# Patient Record
Sex: Female | Born: 1980 | Race: White | Hispanic: No | Marital: Married
Health system: Southern US, Community
[De-identification: ages and names within clinical notes are randomized; demographics above are authoritative.]

## PROBLEM LIST (undated history)

## (undated) DIAGNOSIS — I10 Essential (primary) hypertension: Secondary | ICD-10-CM

## (undated) DIAGNOSIS — F419 Anxiety disorder, unspecified: Secondary | ICD-10-CM

---

## 2006-12-05 ENCOUNTER — Other Ambulatory Visit: Admission: RE | Admit: 2006-12-05 | Discharge: 2006-12-05 | Payer: Self-pay | Admitting: Obstetrics and Gynecology

## 2011-06-22 ENCOUNTER — Other Ambulatory Visit: Payer: Self-pay | Admitting: Obstetrics and Gynecology

## 2011-06-22 DIAGNOSIS — Z1231 Encounter for screening mammogram for malignant neoplasm of breast: Secondary | ICD-10-CM

## 2011-07-18 ENCOUNTER — Ambulatory Visit
Admission: RE | Admit: 2011-07-18 | Discharge: 2011-07-18 | Disposition: A | Discharge status: Home / Self Care | Payer: PRIVATE HEALTH INSURANCE | Source: Ambulatory Visit | Attending: Obstetrics and Gynecology | Admitting: Obstetrics and Gynecology

## 2011-07-18 DIAGNOSIS — Z1231 Encounter for screening mammogram for malignant neoplasm of breast: Secondary | ICD-10-CM

## 2013-07-03 ENCOUNTER — Other Ambulatory Visit: Payer: Self-pay

## 2013-07-03 DIAGNOSIS — Z1231 Encounter for screening mammogram for malignant neoplasm of breast: Secondary | ICD-10-CM

## 2013-07-03 DIAGNOSIS — Z803 Family history of malignant neoplasm of breast: Secondary | ICD-10-CM

## 2013-07-22 ENCOUNTER — Ambulatory Visit
Admission: RE | Admit: 2013-07-22 | Discharge: 2013-07-22 | Disposition: A | Discharge status: Home / Self Care | Payer: BC Managed Care – PPO | Source: Ambulatory Visit

## 2013-07-22 DIAGNOSIS — Z803 Family history of malignant neoplasm of breast: Secondary | ICD-10-CM

## 2013-07-22 DIAGNOSIS — Z1231 Encounter for screening mammogram for malignant neoplasm of breast: Secondary | ICD-10-CM

## 2014-06-23 ENCOUNTER — Other Ambulatory Visit: Payer: Self-pay

## 2014-06-23 DIAGNOSIS — Z1231 Encounter for screening mammogram for malignant neoplasm of breast: Secondary | ICD-10-CM

## 2014-07-28 ENCOUNTER — Ambulatory Visit: Payer: Self-pay

## 2014-08-01 ENCOUNTER — Ambulatory Visit: Payer: Self-pay

## 2014-08-04 ENCOUNTER — Ambulatory Visit: Payer: Self-pay

## 2014-08-05 ENCOUNTER — Ambulatory Visit
Admission: RE | Admit: 2014-08-05 | Discharge: 2014-08-05 | Disposition: A | Discharge status: Home / Self Care | Payer: BLUE CROSS/BLUE SHIELD | Source: Ambulatory Visit

## 2014-08-05 DIAGNOSIS — Z1231 Encounter for screening mammogram for malignant neoplasm of breast: Secondary | ICD-10-CM

## 2015-09-29 ENCOUNTER — Other Ambulatory Visit: Payer: Self-pay | Admitting: Obstetrics and Gynecology

## 2015-09-29 DIAGNOSIS — R5381 Other malaise: Secondary | ICD-10-CM

## 2015-09-30 ENCOUNTER — Other Ambulatory Visit: Payer: Self-pay | Admitting: Obstetrics and Gynecology

## 2015-09-30 DIAGNOSIS — Z803 Family history of malignant neoplasm of breast: Secondary | ICD-10-CM

## 2015-09-30 DIAGNOSIS — Z1231 Encounter for screening mammogram for malignant neoplasm of breast: Secondary | ICD-10-CM

## 2015-10-19 ENCOUNTER — Ambulatory Visit
Admission: RE | Admit: 2015-10-19 | Discharge: 2015-10-19 | Disposition: A | Discharge status: Home / Self Care | Payer: BLUE CROSS/BLUE SHIELD | Source: Ambulatory Visit | Attending: Obstetrics and Gynecology | Admitting: Obstetrics and Gynecology

## 2015-10-19 DIAGNOSIS — Z803 Family history of malignant neoplasm of breast: Secondary | ICD-10-CM

## 2015-10-19 DIAGNOSIS — Z1231 Encounter for screening mammogram for malignant neoplasm of breast: Secondary | ICD-10-CM | POA: Diagnosis not present

## 2016-02-29 DIAGNOSIS — R05 Cough: Secondary | ICD-10-CM | POA: Diagnosis not present

## 2016-02-29 DIAGNOSIS — J309 Allergic rhinitis, unspecified: Secondary | ICD-10-CM | POA: Diagnosis not present

## 2016-02-29 DIAGNOSIS — J0100 Acute maxillary sinusitis, unspecified: Secondary | ICD-10-CM | POA: Diagnosis not present

## 2016-09-06 DIAGNOSIS — Z01419 Encounter for gynecological examination (general) (routine) without abnormal findings: Secondary | ICD-10-CM | POA: Diagnosis not present

## 2016-09-06 DIAGNOSIS — Z6824 Body mass index (BMI) 24.0-24.9, adult: Secondary | ICD-10-CM | POA: Diagnosis not present

## 2017-09-08 DIAGNOSIS — Z6824 Body mass index (BMI) 24.0-24.9, adult: Secondary | ICD-10-CM | POA: Diagnosis not present

## 2017-09-08 DIAGNOSIS — Z1151 Encounter for screening for human papillomavirus (HPV): Secondary | ICD-10-CM | POA: Diagnosis not present

## 2017-09-08 DIAGNOSIS — Z01419 Encounter for gynecological examination (general) (routine) without abnormal findings: Secondary | ICD-10-CM | POA: Diagnosis not present

## 2017-09-12 ENCOUNTER — Other Ambulatory Visit: Payer: Self-pay | Admitting: Obstetrics and Gynecology

## 2017-09-12 DIAGNOSIS — Z139 Encounter for screening, unspecified: Secondary | ICD-10-CM

## 2017-10-11 HISTORY — PX: BREAST BIOPSY: SHX20

## 2017-10-23 ENCOUNTER — Ambulatory Visit
Admission: RE | Admit: 2017-10-23 | Discharge: 2017-10-23 | Disposition: A | Discharge status: Home / Self Care | Payer: BLUE CROSS/BLUE SHIELD | Source: Ambulatory Visit | Attending: Obstetrics and Gynecology | Admitting: Obstetrics and Gynecology

## 2017-10-23 DIAGNOSIS — Z1231 Encounter for screening mammogram for malignant neoplasm of breast: Secondary | ICD-10-CM | POA: Diagnosis not present

## 2017-10-23 DIAGNOSIS — Z139 Encounter for screening, unspecified: Secondary | ICD-10-CM

## 2017-10-25 ENCOUNTER — Other Ambulatory Visit: Payer: Self-pay | Admitting: Obstetrics and Gynecology

## 2017-10-25 DIAGNOSIS — R928 Other abnormal and inconclusive findings on diagnostic imaging of breast: Secondary | ICD-10-CM

## 2017-10-31 ENCOUNTER — Ambulatory Visit
Admission: RE | Admit: 2017-10-31 | Discharge: 2017-10-31 | Disposition: A | Discharge status: Home / Self Care | Payer: BLUE CROSS/BLUE SHIELD | Source: Ambulatory Visit | Attending: Obstetrics and Gynecology | Admitting: Obstetrics and Gynecology

## 2017-10-31 ENCOUNTER — Other Ambulatory Visit: Payer: Self-pay | Admitting: Obstetrics and Gynecology

## 2017-10-31 DIAGNOSIS — R928 Other abnormal and inconclusive findings on diagnostic imaging of breast: Secondary | ICD-10-CM

## 2017-10-31 DIAGNOSIS — R922 Inconclusive mammogram: Secondary | ICD-10-CM | POA: Diagnosis not present

## 2017-10-31 DIAGNOSIS — N6321 Unspecified lump in the left breast, upper outer quadrant: Secondary | ICD-10-CM | POA: Diagnosis not present

## 2017-10-31 DIAGNOSIS — N6320 Unspecified lump in the left breast, unspecified quadrant: Secondary | ICD-10-CM

## 2017-10-31 DIAGNOSIS — N6323 Unspecified lump in the left breast, lower outer quadrant: Secondary | ICD-10-CM | POA: Diagnosis not present

## 2017-11-01 ENCOUNTER — Ambulatory Visit
Admission: RE | Admit: 2017-11-01 | Discharge: 2017-11-01 | Disposition: A | Discharge status: Home / Self Care | Payer: BLUE CROSS/BLUE SHIELD | Source: Ambulatory Visit | Attending: Obstetrics and Gynecology | Admitting: Obstetrics and Gynecology

## 2017-11-01 ENCOUNTER — Other Ambulatory Visit: Payer: Self-pay | Admitting: Obstetrics and Gynecology

## 2017-11-01 DIAGNOSIS — N6323 Unspecified lump in the left breast, lower outer quadrant: Secondary | ICD-10-CM | POA: Diagnosis not present

## 2017-11-01 DIAGNOSIS — N6321 Unspecified lump in the left breast, upper outer quadrant: Secondary | ICD-10-CM | POA: Diagnosis not present

## 2017-11-01 DIAGNOSIS — N6320 Unspecified lump in the left breast, unspecified quadrant: Secondary | ICD-10-CM

## 2017-11-01 DIAGNOSIS — N6322 Unspecified lump in the left breast, upper inner quadrant: Secondary | ICD-10-CM | POA: Diagnosis not present

## 2017-11-01 DIAGNOSIS — N6012 Diffuse cystic mastopathy of left breast: Secondary | ICD-10-CM | POA: Diagnosis not present

## 2018-06-20 DIAGNOSIS — J329 Chronic sinusitis, unspecified: Secondary | ICD-10-CM | POA: Diagnosis not present

## 2018-06-20 DIAGNOSIS — B9689 Other specified bacterial agents as the cause of diseases classified elsewhere: Secondary | ICD-10-CM | POA: Diagnosis not present

## 2018-07-13 ENCOUNTER — Ambulatory Visit (INDEPENDENT_AMBULATORY_CARE_PROVIDER_SITE_OTHER): Payer: BLUE CROSS/BLUE SHIELD | Admitting: Family Medicine

## 2018-07-13 ENCOUNTER — Encounter: Payer: Self-pay | Admitting: Family Medicine

## 2018-07-13 VITALS — BP 136/98 | HR 125 | Temp 97.6°F | Ht 60.0 in | Wt 125.6 lb

## 2018-07-13 DIAGNOSIS — Z0000 Encounter for general adult medical examination without abnormal findings: Secondary | ICD-10-CM

## 2018-07-13 DIAGNOSIS — R030 Elevated blood-pressure reading, without diagnosis of hypertension: Secondary | ICD-10-CM

## 2018-07-13 NOTE — Progress Notes (Signed)
Patient: Meghan NorthMegan Rumbold MRN: [id number] DOB: [birth year] PCP: Orland MustardWolfe, Kristin Barcus, MD     Subjective:  Chief Complaint  Patient presents with  . Establish Care    HPI: The patient is a 38 y.o. female who presents today for annual exam. She denies any changes to past medical history. There have been no recent hospitalizations. They are following a well balanced diet and exercise plan. Weight has been stable. No complaints today.   She has a family history of breast cancer in her mother, great grandmother maternal side and paternal grandmother. Mom was diagnosed at age 37 years old. She had repeat when she was 43 years s/p double mastectomy. No family history of colon cancer.   There is no immunization history on file for this patient.  Mammogram: 10/2017. Normal. Yearly with mom history  Pap smear: 09/2017. Normal. -julie fisher-NP at [facility]  Hiv: unsure Tdap: 11/29/2016   Review of Systems  Constitutional: Negative for chills, fatigue and fever.  HENT: Negative for dental problem, ear pain, hearing loss and trouble swallowing.   Eyes: Negative for visual disturbance.  Respiratory: Negative for cough, chest tightness and shortness of breath.   Cardiovascular: Negative for chest pain, palpitations and leg swelling.  Gastrointestinal: Negative for abdominal pain, blood in stool, constipation, diarrhea and nausea.  Endocrine: Negative for cold intolerance, polydipsia, polyphagia and polyuria.  Genitourinary: Negative.  Negative for dysuria and hematuria.  Musculoskeletal: Negative for arthralgias, back pain and neck pain.  Skin: Negative.  Negative for rash.  Neurological: Negative for dizziness and headaches.  Psychiatric/Behavioral: Positive for sleep disturbance. Negative for dysphoric mood. The patient is nervous/anxious.     Allergies Patient has No Known Allergies.  Past Medical History Patient  has no past medical history on file.  Surgical History Patient  has no past  surgical history on file.  Family History Pateint's family history includes Breast cancer in her mother.  Social History Patient  reports that she has quit smoking. She has never used smokeless tobacco.    Objective: Vitals:   07/13/18 0800 07/13/18 0808  BP: (!) 138/92 (!) 136/98  Pulse: (!) 125   Temp: 97.6 F (36.4 C)   TempSrc: Oral   SpO2: 98%   Weight: 125 lb 9.6 oz (57 kg)   Height: 5' (1.524 m)     Body mass index is 24.53 kg/m.  Physical Exam Vitals signs reviewed.  Constitutional:      Appearance: She is well-developed.  HENT:     Right Ear: External ear normal.     Left Ear: External ear normal.  Eyes:     Conjunctiva/sclera: Conjunctivae normal.     Pupils: Pupils are equal, round, and reactive to light.  Neck:     Musculoskeletal: Normal range of motion and neck supple.     Thyroid: No thyromegaly.  Cardiovascular:     Rate and Rhythm: Regular rhythm. Tachycardia present.     Heart sounds: Normal heart sounds. No murmur.  Pulmonary:     Effort: Pulmonary effort is normal.     Breath sounds: Normal breath sounds.  Abdominal:     General: Bowel sounds are normal. There is no distension.     Palpations: Abdomen is soft.     Tenderness: There is no abdominal tenderness.  Lymphadenopathy:     Cervical: No cervical adenopathy.  Skin:    General: Skin is warm and dry.     Findings: No rash.  Neurological:     Mental Status: She is  alert and oriented to person, place, and time.     Cranial Nerves: No cranial nerve deficit.     Coordination: Coordination normal.     Deep Tendon Reflexes: Reflexes normal.  Psychiatric:        Behavior: Behavior normal.    Depression screen [facility] 2/9 07/13/2018  Decreased Interest 0  Down, Depressed, Hopeless 0  PHQ - 2 Score 0       Assessment/plan: 1. Annual physical exam Requesting records for pap/labs. She is unsure if she can get all of her labs, but will have them done again this summer through her husband's  work. If needs anything else, will come back in. Otherwise, keep up exercise/healthy diet. We are going to watch her blood pressure. Heart rate always elevated in doctor's office. F/u in one year. Patient counseling [x]    Nutrition: Stressed importance of moderation in sodium/caffeine intake, saturated fat and cholesterol, caloric balance, sufficient intake of fresh fruits, vegetables, fiber, calcium, iron, and 1 mg of folate supplement per day (for females capable of pregnancy).  [x]    Stressed the importance of regular exercise.   []    Substance Abuse: Discussed cessation/primary prevention of tobacco, alcohol, or other drug use; driving or other dangerous activities under the influence; availability of treatment for abuse.   [x]    Injury prevention: Discussed safety belts, safety helmets, smoke detector, smoking near bedding or upholstery.   [x]    Sexuality: Discussed sexually transmitted diseases, partner selection, use of condoms, avoidance of unintended pregnancy  and contraceptive alternatives.  [x]    Dental health: Discussed importance of regular tooth brushing, flossing, and dental visits.  [x]    Health maintenance and immunizations reviewed. Please refer to Health maintenance section.   2. Elevated blood pressure -diastolic a bit above goal. She is going to start a home log and monitor it over the next 3 months. Will let me know how she is doing. Goal is <130/90. She will come back in for appointment if elevated. DASH diet discussed, exercise already in place and weight is good. No family history.    Return in about 1 year (around 07/14/2019).   Orland Mustard, MD [facility]  07/13/2018

## 2018-09-18 ENCOUNTER — Other Ambulatory Visit: Payer: Self-pay | Admitting: Obstetrics and Gynecology

## 2018-09-18 DIAGNOSIS — Z1231 Encounter for screening mammogram for malignant neoplasm of breast: Secondary | ICD-10-CM

## 2018-11-07 DIAGNOSIS — Z6824 Body mass index (BMI) 24.0-24.9, adult: Secondary | ICD-10-CM | POA: Diagnosis not present

## 2018-11-07 DIAGNOSIS — Z01419 Encounter for gynecological examination (general) (routine) without abnormal findings: Secondary | ICD-10-CM | POA: Diagnosis not present

## 2018-11-22 ENCOUNTER — Ambulatory Visit
Admission: RE | Admit: 2018-11-22 | Discharge: 2018-11-22 | Disposition: A | Discharge status: Home / Self Care | Payer: BC Managed Care – PPO | Source: Ambulatory Visit | Attending: Obstetrics and Gynecology | Admitting: Obstetrics and Gynecology

## 2018-11-22 ENCOUNTER — Other Ambulatory Visit: Payer: Self-pay

## 2018-11-22 DIAGNOSIS — Z1231 Encounter for screening mammogram for malignant neoplasm of breast: Secondary | ICD-10-CM

## 2019-07-12 DIAGNOSIS — Z20828 Contact with and (suspected) exposure to other viral communicable diseases: Secondary | ICD-10-CM | POA: Diagnosis not present

## 2019-07-19 ENCOUNTER — Encounter: Payer: BLUE CROSS/BLUE SHIELD | Admitting: Family Medicine

## 2019-09-11 ENCOUNTER — Ambulatory Visit (INDEPENDENT_AMBULATORY_CARE_PROVIDER_SITE_OTHER): Payer: BC Managed Care – PPO | Admitting: Family Medicine

## 2019-09-11 ENCOUNTER — Other Ambulatory Visit: Payer: Self-pay

## 2019-09-11 ENCOUNTER — Encounter: Payer: Self-pay | Admitting: Family Medicine

## 2019-09-11 VITALS — BP 166/100 | HR 143 | Temp 97.8°F | Ht 60.0 in | Wt 129.8 lb

## 2019-09-11 DIAGNOSIS — Z0000 Encounter for general adult medical examination without abnormal findings: Secondary | ICD-10-CM | POA: Diagnosis not present

## 2019-09-11 DIAGNOSIS — E049 Nontoxic goiter, unspecified: Secondary | ICD-10-CM | POA: Diagnosis not present

## 2019-09-11 DIAGNOSIS — Z114 Encounter for screening for human immunodeficiency virus [HIV]: Secondary | ICD-10-CM

## 2019-09-11 DIAGNOSIS — F411 Generalized anxiety disorder: Secondary | ICD-10-CM | POA: Insufficient documentation

## 2019-09-11 DIAGNOSIS — R030 Elevated blood-pressure reading, without diagnosis of hypertension: Secondary | ICD-10-CM

## 2019-09-11 LAB — COMPREHENSIVE METABOLIC PANEL
ALT: 27 U/L (ref 0–35)
AST: 22 U/L (ref 0–37)
Albumin: 5.1 g/dL (ref 3.5–5.2)
Alkaline Phosphatase: 67 U/L (ref 39–117)
BUN: 11 mg/dL (ref 6–23)
CO2: 25 mEq/L (ref 19–32)
Calcium: 10 mg/dL (ref 8.4–10.5)
Chloride: 100 mEq/L (ref 96–112)
Creatinine, Ser: 0.66 mg/dL (ref 0.40–1.20)
GFR: 100.38 mL/min (ref >60.00)
Glucose, Bld: 105 mg/dL — ABNORMAL HIGH (ref 70–99)
Potassium: 3.9 mEq/L (ref 3.5–5.1)
Sodium: 137 mEq/L (ref 135–145)
Total Bilirubin: 1.3 mg/dL — ABNORMAL HIGH (ref 0.2–1.2)
Total Protein: 8.3 g/dL (ref 6.0–8.3)

## 2019-09-11 LAB — CBC WITH DIFFERENTIAL/PLATELET
Basophils Absolute: 0 10*3/uL (ref 0.0–0.1)
Basophils Relative: 0.6 % (ref 0.0–3.0)
Eosinophils Absolute: 0.1 10*3/uL (ref 0.0–0.7)
Eosinophils Relative: 1 % (ref 0.0–5.0)
HCT: 43.4 % (ref 36.0–46.0)
Hemoglobin: 14.8 g/dL (ref 12.0–15.0)
Lymphocytes Relative: 27.8 % (ref 12.0–46.0)
Lymphs Abs: 2.2 10*3/uL (ref 0.7–4.0)
MCHC: 34.1 g/dL (ref 30.0–36.0)
MCV: 89.3 fl (ref 78.0–100.0)
Monocytes Absolute: 0.5 10*3/uL (ref 0.1–1.0)
Monocytes Relative: 5.8 % (ref 3.0–12.0)
Neutro Abs: 5.2 10*3/uL (ref 1.4–7.7)
Neutrophils Relative %: 64.8 % (ref 43.0–77.0)
Platelets: 371 10*3/uL (ref 150.0–400.0)
RBC: 4.87 Mil/uL (ref 3.87–5.11)
RDW: 12.8 % (ref 11.5–15.5)
WBC: 8 10*3/uL (ref 4.0–10.5)

## 2019-09-11 LAB — LIPID PANEL
Cholesterol: 226 mg/dL — ABNORMAL HIGH (ref 0–200)
HDL: 62.2 mg/dL (ref >39.00)
LDL Cholesterol: 142 mg/dL — ABNORMAL HIGH (ref 0–99)
NonHDL: 164.19
Total CHOL/HDL Ratio: 4
Triglycerides: 112 mg/dL (ref 0.0–149.0)
VLDL: 22.4 mg/dL (ref 0.0–40.0)

## 2019-09-11 LAB — VITAMIN D 25 HYDROXY (VIT D DEFICIENCY, FRACTURES): VITD: 33.51 ng/mL (ref 30.00–100.00)

## 2019-09-11 LAB — TSH: TSH: 1.17 u[IU]/mL (ref 0.35–4.50)

## 2019-09-11 MED ORDER — FLUOXETINE HCL 20 MG PO TABS: 20.0000 mg | ORAL_TABLET | Freq: Every day | ORAL | 0 refills | Status: DC

## 2019-09-11 NOTE — Progress Notes (Signed)
Patient: Meghan Shaw MRN: [id number] DOB: Jan 20, 1981 PCP: Orma Flaming, MD     Subjective:  Chief Complaint  Patient presents with  . Annual Exam    HPI: The patient is a 39 y.o. female who presents today for annual exam. She denies any changes to past medical history. There have been no recent hospitalizations. They are following a well balanced diet and exercise plan. Patient exercises at least 2-3 times per week. She says it has helped a lot with anxiety.  Weight has been stable. Patient is concerned about Anxiety.   Anxiety: She states this started a few months, but states she has always had anxiety. Since covid she started to have panic attacks. She has anxiety attacks about twice a year. No precipitating factors. No family issues, work issues or relational issues. She exercises regularly. She has done counseling in the past, but not for anxiety. Family history of anxiety in her brother. She has never been on medication. She sleeps good.   Elevated blood pressure: family hx in her father.    Immunization History  Administered Date(s) Administered  . PFIZER SARS-COV-2 Vaccination 08/17/2019   Colonoscopy:  Not of age Mammogram: routine screening.  Pap smear: 09/2019.   Had her first covid shot. 2nd shot is Saturday.   Review of Systems  Constitutional: Negative for chills, fatigue and fever.  HENT: Negative for dental problem, ear pain, hearing loss, sinus pressure, sinus pain, sore throat and trouble swallowing.   Eyes: Negative for visual disturbance.  Respiratory: Negative for cough, chest tightness and shortness of breath.   Cardiovascular: Negative for chest pain, palpitations and leg swelling.  Gastrointestinal: Negative for abdominal pain, blood in stool, diarrhea, nausea and vomiting.  Endocrine: Negative for cold intolerance, polydipsia, polyphagia and polyuria.  Genitourinary: Negative for dysuria and hematuria.  Musculoskeletal: Negative for arthralgias.   Skin: Negative for rash.  Neurological: Negative for dizziness, light-headedness and headaches.  Psychiatric/Behavioral: Negative for confusion, dysphoric mood and sleep disturbance. The patient is nervous/anxious.     Allergies Patient has No Known Allergies.  Past Medical History Patient  has no past medical history on file.  Surgical History Patient  has a past surgical history that includes Breast biopsy (Left, 10/2017).  Family History Pateint's family history includes Breast cancer in her mother.  Social History Patient  reports that she has quit smoking. She has never used smokeless tobacco.    Objective: Vitals:   09/11/19 1327 09/11/19 1353  BP: (!) 162/120 (!) 166/100  Pulse: (!) 143   Temp: 97.8 F (36.6 C)   TempSrc: Temporal   SpO2: 98%   Weight: 129 lb 12.8 oz (58.9 kg)   Height: 5' (1.524 m)     Body mass index is 25.35 kg/m.  Physical Exam Vitals reviewed.  Constitutional:      Appearance: Normal appearance. She is well-developed and normal weight.  HENT:     Head: Normocephalic and atraumatic.     Right Ear: Tympanic membrane, ear canal and external ear normal.     Left Ear: Tympanic membrane, ear canal and external ear normal.  Eyes:     Conjunctiva/sclera: Conjunctivae normal.     Pupils: Pupils are equal, round, and reactive to light.  Neck:     Thyroid: No thyromegaly.     Comments: Enlarged thyroid, right lower lobe.  Cardiovascular:     Rate and Rhythm: Regular rhythm. Tachycardia present.     Pulses: Normal pulses.     Heart sounds: Normal  heart sounds. No murmur.  Pulmonary:     Effort: Pulmonary effort is normal.     Breath sounds: Normal breath sounds.  Abdominal:     General: Bowel sounds are normal. There is no distension.     Palpations: Abdomen is soft.     Tenderness: There is no abdominal tenderness.  Musculoskeletal:     Cervical back: Normal range of motion and neck supple.  Lymphadenopathy:     Cervical: No cervical  adenopathy.  Skin:    General: Skin is warm and dry.     Capillary Refill: Capillary refill takes less than 2 seconds.     Findings: No rash.  Neurological:     General: No focal deficit present.     Mental Status: She is alert and oriented to person, place, and time.     Cranial Nerves: No cranial nerve deficit.     Coordination: Coordination normal.     Deep Tendon Reflexes: Reflexes normal.  Psychiatric:        Mood and Affect: Mood normal.        Behavior: Behavior normal.     Comments: Tearful at times. Anxious       Office Visit from 09/11/2019 in [facility]  PHQ-2 Total Score  0          GAD 7 : Generalized Anxiety Score 09/11/2019  Nervous, Anxious, on Edge 1  Control/stop worrying 1  Worry too much - different things 3  Trouble relaxing 1  Restless 1  Easily annoyed or irritable 3  Afraid - awful might happen 3  Total GAD 7 Score 13  Anxiety Difficulty Not difficult at all     Assessment/plan: 1. Annual physical exam Routine fasting lab work today. HM discussed. UTD. Gets pap smears at [facility]. Healthy and exercising. F/u in one year or as needed.  Patient counseling [x]    Nutrition: Stressed importance of moderation in sodium/caffeine intake, saturated fat and cholesterol, caloric balance, sufficient intake of fresh fruits, vegetables, fiber, calcium, iron, and 1 mg of folate supplement per day (for females capable of pregnancy).  [x]    Stressed the importance of regular exercise.   []    Substance Abuse: Discussed cessation/primary prevention of tobacco, alcohol, or other drug use; driving or other dangerous activities under the influence; availability of treatment for abuse.   [x]    Injury prevention: Discussed safety belts, safety helmets, smoke detector, smoking near bedding or upholstery.   [x]    Sexuality: Discussed sexually transmitted diseases, partner selection, use of condoms, avoidance of unintended pregnancy  and  contraceptive alternatives.  [x]    Dental health: Discussed importance of regular tooth brushing, flossing, and dental visits.  [x]    Health maintenance and immunizations reviewed. Please refer to Health maintenance section.    - CBC with Differential/Platelet - Comprehensive metabolic panel - Lipid panel - TSH - VITAMIN D 25 Hydroxy (Vit-D Deficiency, Fractures)  2. Encounter for screening for HIV  - HIV Antibody (routine testing w rflx)  3. Enlarged thyroid gland  - THYROID; Future  4. GAD (generalized anxiety disorder) Checking labs to rule out pathological cause. ? If HTN contributing as well. GAD7 significantly elevated and she is noticeably nervous. Discussed since affecting her daily life, I def. Think we should start medication if labs normal. Sending in prozac to start low dose daily. I've explained to her that drugs of the SSRI class can have side effects such as weight gain, sexual dysfunction, insomnia, headache, nausea.  These medications are generally effective at alleviating symptoms of anxiety and/or depression. Let me know if significant side effects do occur. Any si/hi she is to call 911 or go to Er. Close f/u with me in 1 months.   5. Elevated blood pressure -never came back due to covid on last visit. Unsure if this is contributing to anxiety or anxiety is contributing to this. We are going to have her keep a log at home x 1 month. She is to call me if consistently elevated after 2 weeks. Close follow up in one month. Recommended she cut salt out of diet as well. Already exercising. If elevated at next visit with elevated readings we will initiate therapy, get ekg and urine baseline. precautions given.   This visit occurred during the SARS-CoV-2 public health emergency.  Safety protocols were in place, including screening questions prior to the visit, additional usage of staff PPE, and extensive cleaning of exam room while observing appropriate contact time as indicated  for disinfecting solutions.     Return in about 1 month (around 10/11/2019) for anxiety/blood pressure .     Orland Mustard, MD [facility]  09/11/2019

## 2019-09-11 NOTE — Patient Instructions (Signed)
1) got get blood pressure cuff and start taking blood pressure 4x/week or so. Cut out sodium. We will see you back in one month or email me sooner if running high >140/90.   2) getting ultrasound of your thyroid. They will call you with this.   3) starting prozac for anxiety. Will take one/day in the AM. Takes about 3-4 weeks to feel an affect. Will see you back in  One month.   You're doing great.  Dr. Artis Flock   Hypertension, Adult Hypertension is another name for high blood pressure. High blood pressure forces your heart to work harder to pump blood. This can cause problems over time. There are two numbers in a blood pressure reading. There is a top number (systolic) over a bottom number (diastolic). It is best to have a blood pressure that is below 120/80. Healthy choices can help lower your blood pressure, or you may need medicine to help lower it. What are the causes? The cause of this condition is not known. Some conditions may be related to high blood pressure. What increases the risk?  Smoking.  Having type 2 diabetes mellitus, high cholesterol, or both.  Not getting enough exercise or physical activity.  Being overweight.  Having too much fat, sugar, calories, or salt (sodium) in your diet.  Drinking too much alcohol.  Having long-term (chronic) kidney disease.  Having a family history of high blood pressure.  Age. Risk increases with age.  Race. You may be at higher risk if you are African American.  Gender. Men are at higher risk than women before age 15. After age 4, women are at higher risk than men.  Having obstructive sleep apnea.  Stress. What are the signs or symptoms?  High blood pressure may not cause symptoms. Very high blood pressure (hypertensive crisis) may cause: ? Headache. ? Feelings of worry or nervousness (anxiety). ? Shortness of breath. ? Nosebleed. ? A feeling of being sick to your stomach (nausea). ? Throwing up (vomiting). ? Changes in  how you see. ? Very bad chest pain. ? Seizures. How is this treated?  This condition is treated by making healthy lifestyle changes, such as: ? Eating healthy foods. ? Exercising more. ? Drinking less alcohol.  Your health care provider may prescribe medicine if lifestyle changes are not enough to get your blood pressure under control, and if: ? Your top number is above 130. ? Your bottom number is above 80.  Your personal target blood pressure may vary. Follow these instructions at home: Eating and drinking   If told, follow the DASH eating plan. To follow this plan: ? Fill one half of your plate at each meal with fruits and vegetables. ? Fill one fourth of your plate at each meal with whole grains. Whole grains include whole-wheat pasta, brown rice, and whole-grain bread. ? Eat or drink low-fat dairy products, such as skim milk or low-fat yogurt. ? Fill one fourth of your plate at each meal with low-fat (lean) proteins. Low-fat proteins include fish, chicken without skin, eggs, beans, and tofu. ? Avoid fatty meat, cured and processed meat, or chicken with skin. ? Avoid pre-made or processed food.  Eat less than 1,500 mg of salt each day.  Do not drink alcohol if: ? Your doctor tells you not to drink. ? You are pregnant, may be pregnant, or are planning to become pregnant.  If you drink alcohol: ? Limit how much you use to:  0-1 drink a day for women.  0-2 drinks a day for men. ? Be aware of how much alcohol is in your drink. In the [place] one drink equals one 12 oz bottle of beer (355 mL), one 5 oz glass of wine (148 mL), or one 1 oz glass of hard liquor (44 mL). Lifestyle   Work with your doctor to stay at a healthy weight or to lose weight. Ask your doctor what the best weight is for you.  Get at least 30 minutes of exercise most days of the week. This may include walking, swimming, or biking.  Get at least 30 minutes of exercise that strengthens your muscles  (resistance exercise) at least 3 days a week. This may include lifting weights or doing Pilates.  Do not use any products that contain nicotine or tobacco, such as cigarettes, e-cigarettes, and chewing tobacco. If you need help quitting, ask your doctor.  Check your blood pressure at home as told by your doctor.  Keep all follow-up visits as told by your doctor. This is important. Medicines  Take over-the-counter and prescription medicines only as told by your doctor. Follow directions carefully.  Do not skip doses of blood pressure medicine. The medicine does not work as well if you skip doses. Skipping doses also puts you at risk for problems.  Ask your doctor about side effects or reactions to medicines that you should watch for. Contact a doctor if you:  Think you are having a reaction to the medicine you are taking.  Have headaches that keep coming back (recurring).  Feel dizzy.  Have swelling in your ankles.  Have trouble with your vision. Get help right away if you:  Get a very bad headache.  Start to feel mixed up (confused).  Feel weak or numb.  Feel faint.  Have very bad pain in your: ? Chest. ? Belly (abdomen).  Throw up more than once.  Have trouble breathing. Summary  Hypertension is another name for high blood pressure.  High blood pressure forces your heart to work harder to pump blood.  For most people, a normal blood pressure is less than 120/80.  Making healthy choices can help lower blood pressure. If your blood pressure does not get lower with healthy choices, you may need to take medicine. This information is not intended to replace advice given to you by your health care provider. Make sure you discuss any questions you have with your health care provider. Document Revised: 02/07/2018 Document Reviewed: 02/07/2018 Elsevier Patient Education  2020 [employer].

## 2019-09-12 LAB — HIV ANTIBODY (ROUTINE TESTING W REFLEX): HIV 1&2 Ab, 4th Generation: NONREACTIVE

## 2019-09-16 ENCOUNTER — Ambulatory Visit
Admission: RE | Admit: 2019-09-16 | Discharge: 2019-09-16 | Disposition: A | Discharge status: Home / Self Care | Payer: BC Managed Care – PPO | Source: Ambulatory Visit | Attending: Family Medicine | Admitting: Family Medicine

## 2019-09-16 DIAGNOSIS — E041 Nontoxic single thyroid nodule: Secondary | ICD-10-CM | POA: Diagnosis not present

## 2019-09-16 DIAGNOSIS — E049 Nontoxic goiter, unspecified: Secondary | ICD-10-CM

## 2019-09-18 ENCOUNTER — Other Ambulatory Visit: Payer: Self-pay | Admitting: Family Medicine

## 2019-09-18 ENCOUNTER — Encounter: Payer: Self-pay | Admitting: Family Medicine

## 2019-09-18 DIAGNOSIS — E042 Nontoxic multinodular goiter: Secondary | ICD-10-CM

## 2019-09-23 DIAGNOSIS — Z20828 Contact with and (suspected) exposure to other viral communicable diseases: Secondary | ICD-10-CM | POA: Diagnosis not present

## 2019-10-09 ENCOUNTER — Other Ambulatory Visit: Payer: Self-pay

## 2019-10-11 ENCOUNTER — Encounter: Payer: Self-pay | Admitting: Endocrinology

## 2019-10-11 ENCOUNTER — Ambulatory Visit (INDEPENDENT_AMBULATORY_CARE_PROVIDER_SITE_OTHER): Payer: BC Managed Care – PPO | Admitting: Endocrinology

## 2019-10-11 ENCOUNTER — Other Ambulatory Visit: Payer: Self-pay

## 2019-10-11 ENCOUNTER — Other Ambulatory Visit (HOSPITAL_COMMUNITY)
Admission: RE | Admit: 2019-10-11 | Discharge: 2019-10-11 | Disposition: A | Discharge status: Home / Self Care | Payer: BC Managed Care – PPO | Source: Ambulatory Visit | Attending: Endocrinology | Admitting: Endocrinology

## 2019-10-11 VITALS — BP 160/98 | HR 136 | Ht 60.0 in | Wt 124.0 lb

## 2019-10-11 DIAGNOSIS — I10 Essential (primary) hypertension: Secondary | ICD-10-CM

## 2019-10-11 DIAGNOSIS — E042 Nontoxic multinodular goiter: Secondary | ICD-10-CM | POA: Insufficient documentation

## 2019-10-11 HISTORY — PX: BIOPSY THYROID: PRO38

## 2019-10-11 NOTE — Progress Notes (Signed)
Subjective:    Patient ID: Meghan Shaw, female    DOB: 07-Nov-1980, 39 y.o.   MRN: [id number]  HPI Pt is referred by Dr Artis Flock, for nodular thyroid.  Pt was noted to have a thyroid nodule, on a routine physical.  she is unaware of ever having had thyroid problems in the past.  He has no h/o XRT or surgery to the neck.   No past medical history on file.  Past Surgical History:  Procedure Laterality Date  . BREAST BIOPSY Left 10/2017   x2   benign    Social History   Socioeconomic History  . Marital status: Married    Spouse name: Not on file  . Number of children: Not on file  . Years of education: Not on file  . Highest education level: Not on file  Occupational History  . Not on file  Tobacco Use  . Smoking status: Former Games developer  . Smokeless tobacco: Never Used  Substance and Sexual Activity  . Alcohol use: Not on file  . Drug use: Not on file  . Sexual activity: Not on file  Other Topics Concern  . Not on file  Social History Narrative  . Not on file   Social Determinants of Health   Financial Resource Strain:   . Difficulty of Paying Living Expenses:   Food Insecurity:   . Worried About Programme researcher, broadcasting/film/video in the Last Year:   . Barista in the Last Year:   Transportation Needs:   . Freight forwarder (Medical):   Marland Kitchen Lack of Transportation (Non-Medical):   Physical Activity:   . Days of Exercise per Week:   . Minutes of Exercise per Session:   Stress:   . Feeling of Stress :   Social Connections:   . Frequency of Communication with Friends and Family:   . Frequency of Social Gatherings with Friends and Family:   . Attends Religious Services:   . Active Member of Clubs or Organizations:   . Attends Banker Meetings:   Marland Kitchen Marital Status:   Intimate Partner Violence:   . Fear of Current or Ex-Partner:   . Emotionally Abused:   Marland Kitchen Physically Abused:   . Sexually Abused:     Current Outpatient Medications on File Prior to Visit    Medication Sig Dispense Refill  . FLUoxetine (PROZAC) 20 MG tablet Take 1 tablet (20 mg total) by mouth daily. 90 tablet 0  . norgestimate-ethinyl estradiol (ORTHO-CYCLEN,SPRINTEC,PREVIFEM) 0.25-35 MG-MCG tablet Take by mouth.     No current facility-administered medications on file prior to visit.    No Known Allergies  Family History  Problem Relation Age of Onset  . Breast cancer Mother   . Thyroid disease Neg Hx     BP (!) 160/98   Pulse (!) 136   Ht 5' (1.524 m)   Wt 124 lb (56.2 kg)   SpO2 98%   BMI 24.22 kg/m    Review of Systems Denies weight change, hoarseness, neck pain, sob, cough, dysphagia, diarrhea, and flushing.       Objective:   Physical Exam VS: see vs page GEN: no distress HEAD: head: no deformity eyes: no periorbital swelling, no proptosis external nose and ears are normal NECK: 2 cm right thyroid nodule is palpable.   CHEST WALL: no deformity LUNGS: clear to auscultation CV: tachycardia rate, but reg rhythm, no murmur.  MUSCULOSKELETAL: muscle bulk and strength are grossly normal.  no obvious  joint swelling.  gait is normal and steady.   EXTEMITIES: no deformity.  no edema PULSES: DP are normal bilat NEURO:  cn 2-12 grossly intact.   readily moves all 4's.  sensation is intact to touch on all 4's.  No tremor.   SKIN:  Normal texture and temperature.  No rash or suspicious lesion is visible.  Not diaphoretic NODES:  None palpable at the neck PSYCH: alert, well-oriented.  Does not appear anxious nor depressed.      Lab Results  Component Value Date   TSH 1.17 09/11/2019   [place]: MNG is noted  I have reviewed outside records, and summarized: Pt was noted to have elevated MNG, and referred here.  HTN, wellness, and GAD were also addressed.     thyroid needle bx: Location: thyroid.   consent obtained, signed form on chart The area is first sprayed with cooling agent local: xylocaine 2%, with epinephrine prep: alcohol pad 4 bxs are done  with 25 and 37T needles no complications      Assessment & Plan:  HTN, new to me.  MNG, new, uncertain etiology.   Patient Instructions  Your blood pressure is high today, and your heart is racing.  Please see your primary care provider soon, to have it rechecked. Blood tests, and a 24-HR urine collection are requested for you today.  We'll let you know about these results, and the biopsy.  If as expected, no caner is found, Please come back for a follow-up appointment in 6 months.

## 2019-10-11 NOTE — Patient Instructions (Addendum)
Your blood pressure is high today, and your heart is racing.  Please see your primary care provider soon, to have it rechecked. Blood tests, and a 24-HR urine collection are requested for you today.  We'll let you know about these results, and the biopsy.  If as expected, no caner is found, Please come back for a follow-up appointment in 6 months.

## 2019-10-14 ENCOUNTER — Telehealth: Payer: Self-pay | Admitting: Endocrinology

## 2019-10-14 ENCOUNTER — Other Ambulatory Visit: Payer: Self-pay

## 2019-10-14 ENCOUNTER — Ambulatory Visit (INDEPENDENT_AMBULATORY_CARE_PROVIDER_SITE_OTHER): Payer: BC Managed Care – PPO | Admitting: Family Medicine

## 2019-10-14 ENCOUNTER — Encounter: Payer: Self-pay | Admitting: Family Medicine

## 2019-10-14 VITALS — BP 160/108 | HR 90 | Temp 97.5°F | Ht 60.0 in | Wt 125.8 lb

## 2019-10-14 DIAGNOSIS — I10 Essential (primary) hypertension: Secondary | ICD-10-CM

## 2019-10-14 DIAGNOSIS — F411 Generalized anxiety disorder: Secondary | ICD-10-CM

## 2019-10-14 LAB — MICROALBUMIN / CREATININE URINE RATIO
Creatinine,U: 236.2 mg/dL
Microalb Creat Ratio: 0.8 mg/g (ref 0.0–30.0)
Microalb, Ur: 1.8 mg/dL (ref 0.0–1.9)

## 2019-10-14 MED ORDER — HYDROCHLOROTHIAZIDE 25 MG PO TABS: 25.0000 mg | ORAL_TABLET | Freq: Every day | ORAL | 3 refills | Status: DC

## 2019-10-14 NOTE — Telephone Encounter (Signed)
Meghan Shaw from [facility] calling stating that the thyroid speciman we sent in to them was done incorrectly. She states it needs to be not written on the whole slide and not written in pencil. And it needs to be written on the rough side rather than the smooth side. [phone]

## 2019-10-14 NOTE — Patient Instructions (Signed)
1) for your blood pressure, starting hctz (diuretic) for blood pressure. I want you to take 1/2 tab daily x 1-2 weeks. If blood pressure is still 140/90 or greater bump up to full pill. Make sure and drink water on this. Will check potassium/pressure in 4-6 weeks.   2) anxiety. Continue 20mg /day. If still feeling anxious, we can bump up to 40mg /day.

## 2019-10-14 NOTE — Progress Notes (Signed)
Patient: Meghan Shaw MRN: [id number] DOB: [birth year] PCP: Orland Mustard, MD     Subjective:  Chief Complaint  Patient presents with  . Hypertension  . Anxiety    HPI: The patient is a 39 y.o. female who presents today for 1 month follow up for  Anxiety and Hypertension.   GAD -I started her on prozac at last visit. She is currently on 20mg /day. She states she feels better. She has had no side effects with this dosage. She still feels anxious everyday but has had a lot of medical stuff happen this month with thyroid biopsy, appointments, etc. She is exercising.    HTN -I had her keep a log over the last month. Her log is elevated consistently, especially the lower number. She feels better than when I saw her last time. She also saw the endocrinologist who is running labs for pheochromocytoma.    Has had her 2nd covid shot.   Review of Systems  Constitutional: Negative for chills, fatigue and fever.  Respiratory: Negative for cough, shortness of breath and wheezing.   Cardiovascular: Negative for chest pain and palpitations.  Neurological: Negative for dizziness, light-headedness and headaches.  Psychiatric/Behavioral: The patient is nervous/anxious.     Allergies Patient has No Known Allergies.  Past Medical History Patient  has no past medical history on file.  Surgical History Patient  has a past surgical history that includes Breast biopsy (Left, 10/2017) and Biopsy thyroid (10/11/2019).  Family History Pateint's family history includes Breast cancer in her mother.  Social History Patient  reports that she has quit smoking. She has never used smokeless tobacco.    Objective: Vitals:   10/14/19 0817 10/14/19 0833  BP: (!) 140/100 (!) 160/108  Pulse: (!) 113 90  Temp: (!) 97.5 F (36.4 C)   TempSrc: Temporal   SpO2: 99%   Weight: 125 lb 12.8 oz (57.1 kg)   Height: 5' (1.524 m)     Body mass index is 24.57 kg/m.  Physical Exam Vitals reviewed.   Constitutional:      Appearance: Normal appearance.  HENT:     Head: Normocephalic and atraumatic.  Eyes:     Extraocular Movements: Extraocular movements intact.     Pupils: Pupils are equal, round, and reactive to light.  Neck:     Comments: Thyromegaly on right lobe. Well healed incision from biopsy  Cardiovascular:     Rate and Rhythm: Normal rate and regular rhythm.     Heart sounds: Normal heart sounds.  Pulmonary:     Effort: Pulmonary effort is normal.     Breath sounds: Normal breath sounds.  Abdominal:     General: Abdomen is flat. Bowel sounds are normal.     Palpations: Abdomen is soft.  Musculoskeletal:     Cervical back: Normal range of motion and neck supple.  Skin:    Capillary Refill: Capillary refill takes less than 2 seconds.  Neurological:     General: No focal deficit present.     Mental Status: She is alert and oriented to person, place, and time.  Psychiatric:        Mood and Affect: Mood normal.        Behavior: Behavior normal.        GAD 7 : Generalized Anxiety Score 10/14/2019 09/11/2019  Nervous, Anxious, on Edge 3 1  Control/stop worrying 2 1  Worry too much - different things 3 3  Trouble relaxing 2 1  Restless 1 1  Easily annoyed  or irritable 2 3  Afraid - awful might happen 3 3  Total GAD 7 Score 16 13  Anxiety Difficulty Somewhat difficult Not difficult at all   Ekg: sinus tachy with rate of 101. Otherwise normal ekg.   Assessment/plan: 1. Hypertension, unspecified type New diagnosis of HTN. Strong family history. Baseline ekg today. Labs done on last visit. Endo ruling out pheochromocytoma. Continue with log. Starting hctz 12.5mg  and can bump up to full pill after 1-2 weeks if BP still >140/90. I suspect she will need full pill and possibly second agent. Already doing low to no salt. Started back exercising. F/u in 4-6 weeks or recheck, potassium check and up/uc today. Let me know if any issues with medication. Encouraged water intake.   - Catecholamines, fractionated, urine, 24 hour - Metanephrines, urine, 24 hour  2. GAD (generalized anxiety disorder) GAD7 is more elevated than on last check. A significant increase; however, she has had a lot of medical stuff this month which she thinks is driving this up. She feels better on the prozac. She wants to keep dosage the same since she is done with her thyroid stuff and treat BP and see how her anxiety pans out. Im on board with this. She will f/u in 6 weeks for blood pressure and we will see if we need to increase her prozac at that time. She can also email me sooner as well. Continue with exercise.     This visit occurred during the SARS-CoV-2 public health emergency.  Safety protocols were in place, including screening questions prior to the visit, additional usage of staff PPE, and extensive cleaning of exam room while observing appropriate contact time as indicated for disinfecting solutions.     Return in about 6 weeks (around 11/25/2019) for blood pressure/anxiety .   Orma Flaming, MD [facility]   10/14/2019

## 2019-10-15 LAB — TIQ-MISC

## 2019-10-16 LAB — CYTOLOGY - NON PAP

## 2019-10-17 ENCOUNTER — Telehealth: Payer: Self-pay | Admitting: Internal Medicine

## 2019-10-17 NOTE — Telephone Encounter (Signed)
When we call back give the session# [id number]  [facility] called stating they needed the "total volume" for this patients labs. [phone]

## 2019-10-17 NOTE — Telephone Encounter (Signed)
I have absolutely no idea what this means.  Patient seen by Dr. Everardo All, not Dr. Elvera Lennox.

## 2019-10-18 ENCOUNTER — Other Ambulatory Visit: Payer: Self-pay

## 2019-10-18 NOTE — Telephone Encounter (Signed)
This is about labs so it has been sent to [facility]

## 2019-10-22 LAB — CATECHOLAMINES, FRACTIONATED, URINE, 24 HOUR
Calc Total (E+NE): 58 mcg/24 h (ref 26–121)
Creatinine, Urine mg/day-CATEUR: 2.11 g/(24.h) (ref 0.50–2.15)
Dopamine 24 Hr Urine: 718 mcg/24 h — ABNORMAL HIGH (ref 52–480)
Norepinephrine, 24H, Ur: 58 mcg/24 h (ref 15–100)
Total Volume: 2500 mL

## 2019-10-22 LAB — METANEPHRINES, URINE, 24 HOUR
Metaneph Total, Ur: 625 mcg/24 h (ref 115–695)
Metanephrines, Ur: 170 mcg/24 h (ref 36–190)
Normetanephrine, 24H Ur: 455 mcg/24 h (ref 35–482)

## 2019-10-23 LAB — METANEPHRINES, PLASMA
Metanephrine, Free: <25 pg/mL (ref <=57)
Normetanephrine, Free: 33 pg/mL (ref <=148)
Total Metanephrines-Plasma: 33 pg/mL (ref <=205)

## 2019-10-23 LAB — CATECHOLAMINES, FRACTIONATED, PLASMA
Dopamine: <10 pg/mL
Epinephrine: 89 pg/mL
Norepinephrine: 549 pg/mL
Total Catecholamines: 638 pg/mL

## 2019-11-08 ENCOUNTER — Other Ambulatory Visit: Payer: Self-pay | Admitting: Family Medicine

## 2019-11-08 DIAGNOSIS — Z1231 Encounter for screening mammogram for malignant neoplasm of breast: Secondary | ICD-10-CM

## 2019-11-15 DIAGNOSIS — Z6823 Body mass index (BMI) 23.0-23.9, adult: Secondary | ICD-10-CM | POA: Diagnosis not present

## 2019-11-15 DIAGNOSIS — Z01419 Encounter for gynecological examination (general) (routine) without abnormal findings: Secondary | ICD-10-CM | POA: Diagnosis not present

## 2019-11-27 ENCOUNTER — Other Ambulatory Visit: Payer: Self-pay

## 2019-11-27 ENCOUNTER — Ambulatory Visit
Admission: RE | Admit: 2019-11-27 | Discharge: 2019-11-27 | Disposition: A | Discharge status: Home / Self Care | Payer: BC Managed Care – PPO | Source: Ambulatory Visit | Attending: Family Medicine | Admitting: Family Medicine

## 2019-11-27 DIAGNOSIS — Z1231 Encounter for screening mammogram for malignant neoplasm of breast: Secondary | ICD-10-CM

## 2019-11-28 ENCOUNTER — Ambulatory Visit (INDEPENDENT_AMBULATORY_CARE_PROVIDER_SITE_OTHER): Payer: BC Managed Care – PPO | Admitting: Family Medicine

## 2019-11-28 ENCOUNTER — Encounter: Payer: Self-pay | Admitting: Family Medicine

## 2019-11-28 VITALS — BP 120/80 | HR 109 | Temp 98.0°F | Ht 60.0 in | Wt 117.2 lb

## 2019-11-28 DIAGNOSIS — R739 Hyperglycemia, unspecified: Secondary | ICD-10-CM

## 2019-11-28 DIAGNOSIS — F411 Generalized anxiety disorder: Secondary | ICD-10-CM

## 2019-11-28 DIAGNOSIS — I10 Essential (primary) hypertension: Secondary | ICD-10-CM

## 2019-11-28 LAB — BASIC METABOLIC PANEL
BUN: 15 mg/dL (ref 6–23)
CO2: 28 mEq/L (ref 19–32)
Calcium: 9.9 mg/dL (ref 8.4–10.5)
Chloride: 98 mEq/L (ref 96–112)
Creatinine, Ser: 0.83 mg/dL (ref 0.40–1.20)
GFR: 76.97 mL/min (ref >60.00)
Glucose, Bld: 112 mg/dL — ABNORMAL HIGH (ref 70–99)
Potassium: 4.4 mEq/L (ref 3.5–5.1)
Sodium: 136 mEq/L (ref 135–145)

## 2019-11-28 LAB — HEMOGLOBIN A1C: Hgb A1c MFr Bld: 5.2 % (ref 4.6–6.5)

## 2019-11-28 NOTE — Patient Instructions (Addendum)
You're doing awesome! So glad your blood pressure is to goal and your anxiety looks great.  See you in 6 months!  Dr. Artis Flock

## 2019-11-28 NOTE — Progress Notes (Signed)
Patient: Meghan Shaw MRN: [id number] DOB: 02/13/1981 PCP: Orma Flaming, MD     Subjective:  Chief Complaint  Patient presents with  . Hypertension  . Anxiety    HPI: The patient is a 39 y.o. female who presents today for Hypertension and Anxiety. She complains of hot flashes with prozac, starting 1 month ago.  Hypertension: Here for follow up of hypertension.  Currently on hctz 25mg  . Home readings range from 614-431 VQMGQQPY/19-50 diastolic. Takes medication as prescribed and denies any side effects. . Weight has been stable. Denies any chest pain, headaches, shortness of breath, vision changes, swelling in lower extremities.   GAD  She is currently on 20mg  of prozac. She can tell a noticeable difference in her anxiety and is very happy with medication. She does have occasional hot flashes which is likely from the prozac, but she states they are tolerable and doesn't want to change medication.   Review of Systems  Respiratory: Negative for cough, shortness of breath and wheezing.   Cardiovascular: Negative for chest pain and palpitations.  Endocrine: Positive for heat intolerance.  Neurological: Negative for dizziness, light-headedness and headaches.    Allergies Patient has No Known Allergies.  Past Medical History Patient  has no past medical history on file.  Surgical History Patient  has a past surgical history that includes Breast biopsy (Left, 10/2017) and Biopsy thyroid (10/11/2019).  Family History Pateint's family history includes Breast cancer in her mother.  Social History Patient  reports that she has quit smoking. She has never used smokeless tobacco.    Objective: Vitals:   11/28/19 0756  BP: 120/80  Pulse: (!) 109  Temp: 98 F (36.7 C)  TempSrc: Temporal  SpO2: 98%  Weight: 117 lb 3.2 oz (53.2 kg)  Height: 5' (1.524 m)    Body mass index is 22.89 kg/m.  Physical Exam Vitals reviewed.  Constitutional:      Appearance: Normal  appearance.  HENT:     Head: Normocephalic and atraumatic.  Neck:     Comments: Thyromegaly  Cardiovascular:     Rate and Rhythm: Regular rhythm. Tachycardia present.     Heart sounds: Normal heart sounds.  Pulmonary:     Effort: Pulmonary effort is normal.     Breath sounds: Normal breath sounds.  Abdominal:     General: Abdomen is flat. Bowel sounds are normal.     Palpations: Abdomen is soft.  Musculoskeletal:     Cervical back: Normal range of motion and neck supple.  Skin:    Capillary Refill: Capillary refill takes less than 2 seconds.  Neurological:     General: No focal deficit present.     Mental Status: She is alert and oriented to person, place, and time.  Psychiatric:        Mood and Affect: Mood normal.        Behavior: Behavior normal.        GAD 7 : Generalized Anxiety Score 11/28/2019 10/14/2019 09/11/2019  Nervous, Anxious, on Edge 1 3 1   Control/stop worrying 1 2 1   Worry too much - different things 1 3 3   Trouble relaxing 0 2 1  Restless 0 1 1  Easily annoyed or irritable 1 2 3   Afraid - awful might happen 1 3 3   Total GAD 7 Score 5 16 13   Anxiety Difficulty Not difficult at all Somewhat difficult Not difficult at all     Assessment/plan: 1. GAD (generalized anxiety disorder) GAD7 significantly improved! She is doing  much, much better. Could be combination of anti anxiety medication and getting her blood pressure under control. Very happy this is working. Continue current dosage of 20mg . F/u in 6 months or sooner if needed.   2. Essential hypertension Blood pressure is to goal. Continue current anti-hypertensive medications per hpi. Refills not needed and potassium will be checked today.  Recommended routine exercise and healthy diet including DASH diet and mediterranean diet. Encouraged weight loss. F/u in 6 months.   - Basic metabolic panel  3. Elevated blood sugar  - Hemoglobin A1c     This visit occurred during the SARS-CoV-2 public health  emergency.  Safety protocols were in place, including screening questions prior to the visit, additional usage of staff PPE, and extensive cleaning of exam room while observing appropriate contact time as indicated for disinfecting solutions.     Return in about 6 months (around 05/29/2020) for anxiety/bp .   05/31/2020, MD [facility]   11/28/2019

## 2019-12-04 ENCOUNTER — Other Ambulatory Visit: Payer: Self-pay | Admitting: Family Medicine

## 2019-12-25 ENCOUNTER — Encounter: Payer: Self-pay | Admitting: Family Medicine

## 2019-12-26 NOTE — Telephone Encounter (Signed)
Please advise 

## 2020-02-03 DIAGNOSIS — Z20822 Contact with and (suspected) exposure to covid-19: Secondary | ICD-10-CM | POA: Diagnosis not present

## 2020-02-25 ENCOUNTER — Other Ambulatory Visit: Payer: Self-pay | Admitting: Family Medicine

## 2020-02-25 MED ORDER — FLUOXETINE HCL 20 MG PO TABS: 20.0000 mg | ORAL_TABLET | Freq: Every day | ORAL | 0 refills | Status: DC

## 2020-04-24 ENCOUNTER — Other Ambulatory Visit: Payer: Self-pay

## 2020-04-24 ENCOUNTER — Encounter: Payer: Self-pay | Admitting: Endocrinology

## 2020-04-24 ENCOUNTER — Ambulatory Visit (INDEPENDENT_AMBULATORY_CARE_PROVIDER_SITE_OTHER): Payer: BC Managed Care – PPO | Admitting: Endocrinology

## 2020-04-24 ENCOUNTER — Other Ambulatory Visit (HOSPITAL_COMMUNITY)
Admission: RE | Admit: 2020-04-24 | Discharge: 2020-04-24 | Disposition: A | Discharge status: Home / Self Care | Payer: BC Managed Care – PPO | Source: Ambulatory Visit | Attending: Endocrinology | Admitting: Endocrinology

## 2020-04-24 VITALS — BP 138/90 | HR 104 | Ht 60.0 in | Wt 124.2 lb

## 2020-04-24 DIAGNOSIS — E042 Nontoxic multinodular goiter: Secondary | ICD-10-CM | POA: Diagnosis not present

## 2020-04-24 DIAGNOSIS — E041 Nontoxic single thyroid nodule: Secondary | ICD-10-CM | POA: Diagnosis not present

## 2020-04-24 NOTE — Progress Notes (Signed)
Subjective:    Patient ID: Meghan Shaw, female    DOB: 07-Aug-1980, 39 y.o.   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  Pt says she can notice the nodule, but it is unchanged.   No past medical history on file.  Past Surgical History:  Procedure Laterality Date  . BIOPSY THYROID  10/11/2019  . BREAST BIOPSY Left 10/2017   x2   benign    Social History   Socioeconomic History  . Marital status: Married    Spouse name: Not on file  . Number of children: Not on file  . Years of education: Not on file  . Highest education level: Not on file  Occupational History  . Not on file  Tobacco Use  . Smoking status: Former Games developer  . Smokeless tobacco: Never Used  Vaping Use  . Vaping Use: Never used  Substance and Sexual Activity  . Alcohol use: Not on file  . Drug use: Not on file  . Sexual activity: Not on file  Other Topics Concern  . Not on file  Social History Narrative  . Not on file   Social Determinants of Health   Financial Resource Strain:   . Difficulty of Paying Living Expenses: Not on file  Food Insecurity:   . Worried About Programme researcher, broadcasting/film/video in the Last Year: Not on file  . Ran Out of Food in the Last Year: Not on file  Transportation Needs:   . Lack of Transportation (Medical): Not on file  . Lack of Transportation (Non-Medical): Not on file  Physical Activity:   . Days of Exercise per Week: Not on file  . Minutes of Exercise per Session: Not on file  Stress:   . Feeling of Stress : Not on file  Social Connections:   . Frequency of Communication with Friends and Family: Not on file  . Frequency of Social Gatherings with Friends and Family: Not on file  . Attends Religious Services: Not on file  . Active Member of Clubs or Organizations: Not on file  . Attends Banker Meetings: Not on file  . Marital Status: Not on file  Intimate Partner Violence:   . Fear of Current  or Ex-Partner: Not on file  . Emotionally Abused: Not on file  . Physically Abused: Not on file  . Sexually Abused: Not on file    Current Outpatient Medications on File Prior to Visit  Medication Sig Dispense Refill  . FLUoxetine (PROZAC) 20 MG tablet Take 1 tablet (20 mg total) by mouth daily. 90 tablet 0  . hydrochlorothiazide (HYDRODIURIL) 25 MG tablet Take 1 tablet (25 mg total) by mouth daily. 90 tablet 3  . norgestimate-ethinyl estradiol (ORTHO-CYCLEN,SPRINTEC,PREVIFEM) 0.25-35 MG-MCG tablet Take by mouth.     No current facility-administered medications on file prior to visit.    No Known Allergies  Family History  Problem Relation Age of Onset  . Breast cancer Mother   . Thyroid disease Neg Hx     BP 138/90   Pulse (!) 104   Ht 5' (1.524 m)   Wt 124 lb 3.2 oz (56.3 kg)   SpO2 98%   BMI 24.26 kg/m    Review of Systems     Objective:   Physical Exam VITAL SIGNS:  See vs page Repeat HR=104 GENERAL: no distress NECK: 2 cm right thyroid nodule is gain palpable.  Lab Results  Component Value Date   TSH 1.17 09/11/2019   thyroid needle bx: Location: thyroid consent obtained, signed form on chart The area is first sprayed with cooling agent local: xylocaine 2%, with epinephrine prep: alcohol pad 4 bxs are done with 25 and 27g needles no complications Sent for Afirma analysis    Assessment & Plan:  We discussed plan based on Afirma result. Patient Instructions  We'll let you know about the biopsy results.   If as expected, no cancer is found, please come back for a follow-up appointment in 6 months.

## 2020-04-24 NOTE — Patient Instructions (Addendum)
We'll let you know about the biopsy results.  If as expected, no cancer is found, please come back for a follow-up appointment in 6 months  

## 2020-04-27 ENCOUNTER — Other Ambulatory Visit: Payer: Self-pay | Admitting: *Deleted

## 2020-04-28 LAB — CYTOLOGY - NON PAP

## 2020-05-21 ENCOUNTER — Other Ambulatory Visit: Payer: Self-pay

## 2020-05-21 ENCOUNTER — Ambulatory Visit (INDEPENDENT_AMBULATORY_CARE_PROVIDER_SITE_OTHER): Payer: BC Managed Care – PPO | Admitting: Family Medicine

## 2020-05-21 ENCOUNTER — Encounter: Payer: Self-pay | Admitting: Family Medicine

## 2020-05-21 VITALS — BP 124/82 | HR 116 | Temp 98.4°F | Ht 60.0 in | Wt 124.6 lb

## 2020-05-21 DIAGNOSIS — I10 Essential (primary) hypertension: Secondary | ICD-10-CM | POA: Diagnosis not present

## 2020-05-21 DIAGNOSIS — F411 Generalized anxiety disorder: Secondary | ICD-10-CM

## 2020-05-21 MED ORDER — FLUOXETINE HCL 20 MG PO TABS: 20.0000 mg | ORAL_TABLET | Freq: Every day | ORAL | 1 refills | Status: DC

## 2020-05-21 NOTE — Patient Instructions (Signed)
-  if you feel like yoru anxiety is worse than you can bump your prozac to 40mg . If your good days are still more than the bad I would start exercising again and stay on the 20mg , but please feel free to increase ot 40mg  if you feel like you are not controlled on the 20mg /day.   Blood pressure is great!   Merry christmas and have so much fun in ! Dr. 

## 2020-05-21 NOTE — Progress Notes (Signed)
Patient: Meghan Shaw MRN: [id number] DOB: 1981/05/29 PCP: Orland Mustard, MD     Subjective:  Chief Complaint  Patient presents with  . Hypertension  . Anxiety    HPI: The patient is a 39 y.o. female who presents today for anxiety and BP.  Hypertension: Here for follow up of hypertension.  Currently on hctz 25mg /day. Home readings range from 128 systolic/80-90 diastolic. Takes medication as prescribed and denies any side effects. Exercise includes none right now, will be getting back into this. Weight has been stable. Denies any chest pain, headaches, shortness of breath, vision changes, swelling in lower extremities.   Tachycardia Typically only at doctors. Runs around 80 at home.    Anxiety She is currently on prozac 20mg /day. She is doing well, but will still have some bad days. She states good days outweigh bad. She feels like she is tolerating well and isn't sure about titrating up the dose. She has not worked out in while. No panic attacks. No side effects from medication.   Review of Systems  Constitutional: Negative for chills, fatigue and fever.  HENT: Negative for dental problem, ear pain, hearing loss and trouble swallowing.   Eyes: Negative for visual disturbance.  Respiratory: Negative for cough, chest tightness and shortness of breath.   Cardiovascular: Negative for chest pain, palpitations and leg swelling.  Gastrointestinal: Negative for abdominal pain, blood in stool, diarrhea and nausea.  Endocrine: Negative for cold intolerance, polydipsia, polyphagia and polyuria.  Genitourinary: Negative for dysuria, frequency, hematuria and urgency.  Musculoskeletal: Negative for arthralgias.  Skin: Negative for rash.  Neurological: Negative for dizziness and headaches.  Psychiatric/Behavioral: Negative for dysphoric mood and sleep disturbance. The patient is not nervous/anxious.     Allergies Patient has No Known Allergies.  Past Medical History Patient  has no  past medical history on file.  Surgical History Patient  has a past surgical history that includes Breast biopsy (Left, 10/2017) and Biopsy thyroid (10/11/2019).  Family History Pateint's family history includes Breast cancer in her mother.  Social History Patient  reports that she has quit smoking. She has never used smokeless tobacco.    Objective: Vitals:   05/21/20 1059 05/21/20 1147  BP: 132/84 124/82  Pulse: (!) 116   Temp: 98.4 F (36.9 C)   TempSrc: Temporal   SpO2: 98%   Weight: 124 lb 9.6 oz (56.5 kg)   Height: 5' (1.524 m)     Body mass index is 24.33 kg/m.  Physical Exam Vitals reviewed.  Constitutional:      Appearance: Normal appearance. She is normal weight.  HENT:     Head: Normocephalic and atraumatic.  Neck:     Comments: Right thyroid goiter  Cardiovascular:     Rate and Rhythm: Regular rhythm. Tachycardia present.     Heart sounds: Normal heart sounds.  Pulmonary:     Effort: Pulmonary effort is normal.     Breath sounds: Normal breath sounds.  Abdominal:     General: Abdomen is flat. Bowel sounds are normal.     Palpations: Abdomen is soft.  Musculoskeletal:     Cervical back: Normal range of motion and neck supple.  Neurological:     General: No focal deficit present.     Mental Status: She is alert and oriented to person, place, and time.  Psychiatric:        Mood and Affect: Mood normal.        Behavior: Behavior normal.      GAD 7 :  Generalized Anxiety Score 05/21/2020 11/28/2019 10/14/2019 09/11/2019  Nervous, Anxious, on Edge 1 1 3 1   Control/stop worrying 1 1 2 1   Worry too much - different things 2 1 3 3   Trouble relaxing 1 0 2 1  Restless 0 0 1 1  Easily annoyed or irritable 1 1 2 3   Afraid - awful might happen 1 1 3 3   Total GAD 7 Score 7 5 16 13   Anxiety Difficulty Not difficult at all Not difficult at all Somewhat difficult Not difficult at all        Assessment/plan: 1. Primary hypertension .Blood pressure is to  goal. Continue current anti-hypertensive medications. Refills not given and routine lab work will be done at next visit. Recommended routine exercise and healthy diet including DASH diet and mediterranean diet. Encouraged weight loss. F/u in 6 months.    2. GAD (generalized anxiety disorder) GAD7 score is mild. She is having more good days than bad days and is not ready to titrate up medication. Do want her to start exercising again and if she feels like her bad days are becoming more frequent she can titrate up to 40mg , just need her to let me know. F/u in 6 months. Refilled her 20mg  px.     This visit occurred during the SARS-CoV-2 public health emergency.  Safety protocols were in place, including screening questions prior to the visit, additional usage of staff PPE, and extensive cleaning of exam room while observing appropriate contact time as indicated for disinfecting solutions.     Return in about 6 months (around 11/19/2020), or if symptoms worsen or fail to improve.   , MD [facility]   05/21/2020

## 2020-05-22 ENCOUNTER — Encounter: Payer: Self-pay | Admitting: Family Medicine

## 2020-05-22 ENCOUNTER — Ambulatory Visit: Payer: BC Managed Care – PPO | Admitting: Family Medicine

## 2020-05-22 DIAGNOSIS — Z20822 Contact with and (suspected) exposure to covid-19: Secondary | ICD-10-CM | POA: Diagnosis not present

## 2020-05-22 MED ORDER — FLUOXETINE HCL 40 MG PO CAPS: 40.0000 mg | ORAL_CAPSULE | Freq: Every day | ORAL | 3 refills | Status: DC

## 2020-05-29 ENCOUNTER — Ambulatory Visit: Payer: BC Managed Care – PPO | Admitting: Family Medicine

## 2020-06-03 DIAGNOSIS — Z20822 Contact with and (suspected) exposure to covid-19: Secondary | ICD-10-CM | POA: Diagnosis not present

## 2020-07-10 ENCOUNTER — Encounter: Payer: Self-pay | Admitting: Endocrinology

## 2020-09-01 ENCOUNTER — Other Ambulatory Visit: Payer: Self-pay | Admitting: Family Medicine

## 2020-09-01 ENCOUNTER — Encounter: Payer: Self-pay | Admitting: Family Medicine

## 2020-09-01 MED ORDER — HYDROCHLOROTHIAZIDE 25 MG PO TABS: 25.0000 mg | ORAL_TABLET | Freq: Every day | ORAL | 0 refills | Status: DC

## 2020-10-23 ENCOUNTER — Ambulatory Visit: Payer: BC Managed Care – PPO | Admitting: Endocrinology

## 2020-11-23 DIAGNOSIS — Z113 Encounter for screening for infections with a predominantly sexual mode of transmission: Secondary | ICD-10-CM | POA: Diagnosis not present

## 2020-11-23 DIAGNOSIS — Z124 Encounter for screening for malignant neoplasm of cervix: Secondary | ICD-10-CM | POA: Diagnosis not present

## 2020-11-23 DIAGNOSIS — Z01419 Encounter for gynecological examination (general) (routine) without abnormal findings: Secondary | ICD-10-CM | POA: Diagnosis not present

## 2020-11-23 DIAGNOSIS — Z01411 Encounter for gynecological examination (general) (routine) with abnormal findings: Secondary | ICD-10-CM | POA: Diagnosis not present

## 2020-11-23 DIAGNOSIS — Z6825 Body mass index (BMI) 25.0-25.9, adult: Secondary | ICD-10-CM | POA: Diagnosis not present

## 2020-11-23 DIAGNOSIS — Z3041 Encounter for surveillance of contraceptive pills: Secondary | ICD-10-CM | POA: Diagnosis not present

## 2020-11-23 LAB — HM PAP SMEAR

## 2020-11-27 ENCOUNTER — Other Ambulatory Visit: Payer: Self-pay | Admitting: Obstetrics and Gynecology

## 2020-11-27 DIAGNOSIS — Z1231 Encounter for screening mammogram for malignant neoplasm of breast: Secondary | ICD-10-CM

## 2020-11-27 DIAGNOSIS — Z803 Family history of malignant neoplasm of breast: Secondary | ICD-10-CM

## 2020-11-28 ENCOUNTER — Other Ambulatory Visit: Payer: Self-pay | Admitting: Family Medicine

## 2020-11-30 ENCOUNTER — Other Ambulatory Visit: Payer: Self-pay

## 2020-12-25 ENCOUNTER — Other Ambulatory Visit: Payer: Self-pay | Admitting: Obstetrics and Gynecology

## 2020-12-29 ENCOUNTER — Telehealth: Payer: Self-pay

## 2020-12-29 NOTE — Telephone Encounter (Signed)
LVM that Dr Wolfe has left the office and to call back for a toc appt 

## 2021-01-21 ENCOUNTER — Other Ambulatory Visit: Payer: Self-pay

## 2021-01-21 ENCOUNTER — Ambulatory Visit
Admission: RE | Admit: 2021-01-21 | Discharge: 2021-01-21 | Disposition: A | Discharge status: Home / Self Care | Payer: BC Managed Care – PPO | Source: Ambulatory Visit | Attending: Obstetrics and Gynecology | Admitting: Obstetrics and Gynecology

## 2021-01-21 DIAGNOSIS — Z1231 Encounter for screening mammogram for malignant neoplasm of breast: Secondary | ICD-10-CM | POA: Diagnosis not present

## 2021-01-21 DIAGNOSIS — Z803 Family history of malignant neoplasm of breast: Secondary | ICD-10-CM

## 2021-01-25 ENCOUNTER — Encounter (HOSPITAL_COMMUNITY)
Admission: RE | Admit: 2021-01-25 | Discharge: 2021-01-25 | Disposition: A | Discharge status: Home / Self Care | Payer: BC Managed Care – PPO | Source: Ambulatory Visit | Attending: Obstetrics and Gynecology | Admitting: Obstetrics and Gynecology

## 2021-01-25 ENCOUNTER — Other Ambulatory Visit: Payer: Self-pay

## 2021-01-25 DIAGNOSIS — Z302 Encounter for sterilization: Secondary | ICD-10-CM | POA: Diagnosis not present

## 2021-01-25 DIAGNOSIS — R000 Tachycardia, unspecified: Secondary | ICD-10-CM | POA: Insufficient documentation

## 2021-01-25 DIAGNOSIS — Z01818 Encounter for other preprocedural examination: Secondary | ICD-10-CM | POA: Insufficient documentation

## 2021-01-25 DIAGNOSIS — Z87891 Personal history of nicotine dependence: Secondary | ICD-10-CM | POA: Diagnosis not present

## 2021-01-25 DIAGNOSIS — I10 Essential (primary) hypertension: Secondary | ICD-10-CM | POA: Diagnosis not present

## 2021-01-25 DIAGNOSIS — Z79899 Other long term (current) drug therapy: Secondary | ICD-10-CM | POA: Diagnosis not present

## 2021-01-25 LAB — BASIC METABOLIC PANEL
Anion gap: 11 (ref 5–15)
BUN: 16 mg/dL (ref 6–20)
CO2: 23 mmol/L (ref 22–32)
Calcium: 9.3 mg/dL (ref 8.9–10.3)
Chloride: 102 mmol/L (ref 98–111)
Creatinine, Ser: 0.61 mg/dL (ref 0.44–1.00)
GFR, Estimated: >60 mL/min (ref >60)
Glucose, Bld: 102 mg/dL — ABNORMAL HIGH (ref 70–99)
Potassium: 4 mmol/L (ref 3.5–5.1)
Sodium: 136 mmol/L (ref 135–145)

## 2021-01-25 LAB — CBC
HCT: 41.6 % (ref 36.0–46.0)
Hemoglobin: 13.9 g/dL (ref 12.0–15.0)
MCH: 30.7 pg (ref 26.0–34.0)
MCHC: 33.4 g/dL (ref 30.0–36.0)
MCV: 91.8 fL (ref 80.0–100.0)
Platelets: 378 10*3/uL (ref 150–400)
RBC: 4.53 MIL/uL (ref 3.87–5.11)
RDW: 12.7 % (ref 11.5–15.5)
WBC: 8.8 10*3/uL (ref 4.0–10.5)
nRBC: 0 % (ref 0.0–0.2)

## 2021-01-25 LAB — HCG, SERUM, QUALITATIVE: Preg, Serum: NEGATIVE

## 2021-01-26 ENCOUNTER — Encounter (HOSPITAL_BASED_OUTPATIENT_CLINIC_OR_DEPARTMENT_OTHER): Payer: Self-pay | Admitting: Obstetrics and Gynecology

## 2021-01-26 ENCOUNTER — Other Ambulatory Visit: Payer: Self-pay

## 2021-01-26 NOTE — Progress Notes (Signed)
Spoke w/ via phone for pre-op interview---pt Lab needs dos---- none              Lab results------cbc, bmp t & s, serum preg all done 01-25-2021 epuc, ekg 01-25-2021 epic COVID test -----patient states asymptomatic no test needed Arrive at -------1130 am 01-28-2021 NPO after MN NO Solid Food.  Clear liquids from MN until---1030 am then npo Med rec completed Medications to take morning of surgery -----fluoxetine Diabetic medication -----n/a Patient instructed no nail polish to be worn day of surgery Patient instructed to bring photo id and insurance card day of surgery Patient aware to have Driver (ride ) / caregiver   spouse Mellody Dance  for 24 hours after surgery  Patient Special Instructions -----none Pre-Op special Istructions -----none Patient verbalized understanding of instructions that were given at this phone interview. Patient denies shortness of breath, chest pain, fever, cough at this phone interview.

## 2021-01-28 ENCOUNTER — Encounter (HOSPITAL_BASED_OUTPATIENT_CLINIC_OR_DEPARTMENT_OTHER)
Admission: RE | Disposition: A | Discharge status: Home / Self Care | Payer: Self-pay | Source: Home / Self Care | Attending: Obstetrics and Gynecology

## 2021-01-28 ENCOUNTER — Other Ambulatory Visit: Payer: Self-pay

## 2021-01-28 ENCOUNTER — Ambulatory Visit (HOSPITAL_BASED_OUTPATIENT_CLINIC_OR_DEPARTMENT_OTHER): Payer: BC Managed Care – PPO | Admitting: Anesthesiology

## 2021-01-28 ENCOUNTER — Ambulatory Visit (HOSPITAL_BASED_OUTPATIENT_CLINIC_OR_DEPARTMENT_OTHER)
Admission: RE | Admit: 2021-01-28 | Discharge: 2021-01-28 | Disposition: A | Discharge status: Home / Self Care | Payer: BC Managed Care – PPO | Attending: Obstetrics and Gynecology | Admitting: Obstetrics and Gynecology

## 2021-01-28 ENCOUNTER — Encounter (HOSPITAL_BASED_OUTPATIENT_CLINIC_OR_DEPARTMENT_OTHER): Payer: Self-pay | Admitting: Obstetrics and Gynecology

## 2021-01-28 DIAGNOSIS — Z79899 Other long term (current) drug therapy: Secondary | ICD-10-CM | POA: Insufficient documentation

## 2021-01-28 DIAGNOSIS — F411 Generalized anxiety disorder: Secondary | ICD-10-CM | POA: Diagnosis not present

## 2021-01-28 DIAGNOSIS — Z302 Encounter for sterilization: Secondary | ICD-10-CM | POA: Diagnosis not present

## 2021-01-28 DIAGNOSIS — Z87891 Personal history of nicotine dependence: Secondary | ICD-10-CM | POA: Diagnosis not present

## 2021-01-28 DIAGNOSIS — I10 Essential (primary) hypertension: Secondary | ICD-10-CM | POA: Insufficient documentation

## 2021-01-28 HISTORY — DX: Essential (primary) hypertension: I10

## 2021-01-28 HISTORY — PX: LAPAROSCOPIC TUBAL LIGATION: SHX1937

## 2021-01-28 HISTORY — DX: Anxiety disorder, unspecified: F41.9

## 2021-01-28 LAB — ABO/RH: ABO/RH(D): O POS

## 2021-01-28 LAB — TYPE AND SCREEN
ABO/RH(D): O POS
Antibody Screen: NEGATIVE

## 2021-01-28 LAB — POCT PREGNANCY, URINE: Preg Test, Ur: NEGATIVE

## 2021-01-28 SURGERY — LIGATION, FALLOPIAN TUBE, LAPAROSCOPIC
Anesthesia: General | Site: Abdomen | Laterality: Bilateral

## 2021-01-28 MED ORDER — BUPIVACAINE HCL (PF) 0.25 % IJ SOLN
INTRAMUSCULAR | Status: DC | PRN
  Administered 2021-01-28: 10 mL

## 2021-01-28 MED ORDER — MIDAZOLAM HCL 2 MG/2ML IJ SOLN
INTRAMUSCULAR | Status: AC
  Filled 2021-01-28: qty 2

## 2021-01-28 MED ORDER — KETOROLAC TROMETHAMINE 30 MG/ML IJ SOLN
INTRAMUSCULAR | Status: AC
  Filled 2021-01-28: qty 1

## 2021-01-28 MED ORDER — ONDANSETRON HCL 4 MG/2ML IJ SOLN
INTRAMUSCULAR | Status: DC | PRN
  Administered 2021-01-28: 4 mg via INTRAVENOUS

## 2021-01-28 MED ORDER — FENTANYL CITRATE (PF) 100 MCG/2ML IJ SOLN
INTRAMUSCULAR | Status: AC
  Filled 2021-01-28: qty 2

## 2021-01-28 MED ORDER — OXYCODONE HCL 5 MG PO TABS
5.0000 mg | ORAL_TABLET | Freq: Once | ORAL | Status: AC | PRN
  Administered 2021-01-28: 5 mg via ORAL

## 2021-01-28 MED ORDER — PROPOFOL 10 MG/ML IV BOLUS
INTRAVENOUS | Status: DC | PRN
  Administered 2021-01-28: 40 mg via INTRAVENOUS
  Administered 2021-01-28: 120 mg via INTRAVENOUS

## 2021-01-28 MED ORDER — SCOPOLAMINE 1 MG/3DAYS TD PT72
MEDICATED_PATCH | TRANSDERMAL | Status: AC
  Filled 2021-01-28: qty 1

## 2021-01-28 MED ORDER — OXYCODONE HCL 5 MG/5ML PO SOLN: 5.0000 mg | Freq: Once | ORAL | Status: AC | PRN

## 2021-01-28 MED ORDER — CHLORHEXIDINE GLUCONATE 0.12 % MT SOLN: 15.0000 mL | Freq: Once | OROMUCOSAL | Status: DC

## 2021-01-28 MED ORDER — LACTATED RINGERS IV SOLN: INTRAVENOUS | Status: DC

## 2021-01-28 MED ORDER — ONDANSETRON HCL 4 MG/2ML IJ SOLN
INTRAMUSCULAR | Status: AC
  Filled 2021-01-28: qty 2

## 2021-01-28 MED ORDER — 0.9 % SODIUM CHLORIDE (POUR BTL) OPTIME
TOPICAL | Status: DC | PRN
  Administered 2021-01-28: 500 mL

## 2021-01-28 MED ORDER — POVIDONE-IODINE 10 % EX SWAB: 2.0000 "application " | Freq: Once | CUTANEOUS | Status: DC

## 2021-01-28 MED ORDER — ROCURONIUM BROMIDE 100 MG/10ML IV SOLN
INTRAVENOUS | Status: DC | PRN
  Administered 2021-01-28: 40 mg via INTRAVENOUS

## 2021-01-28 MED ORDER — ACETAMINOPHEN 500 MG PO TABS
ORAL_TABLET | ORAL | Status: AC
  Filled 2021-01-28: qty 2

## 2021-01-28 MED ORDER — OXYCODONE HCL 5 MG PO TABS
ORAL_TABLET | ORAL | Status: AC
  Filled 2021-01-28: qty 1

## 2021-01-28 MED ORDER — FENTANYL CITRATE (PF) 100 MCG/2ML IJ SOLN
INTRAMUSCULAR | Status: DC | PRN
  Administered 2021-01-28: 50 ug via INTRAVENOUS
  Administered 2021-01-28: 100 ug via INTRAVENOUS
  Administered 2021-01-28: 50 ug via INTRAVENOUS

## 2021-01-28 MED ORDER — ACETAMINOPHEN 500 MG PO TABS
1000.0000 mg | ORAL_TABLET | Freq: Once | ORAL | Status: AC
  Administered 2021-01-28: 1000 mg via ORAL

## 2021-01-28 MED ORDER — OXYCODONE HCL 5 MG PO TABS: 5.0000 mg | ORAL_TABLET | Freq: Four times a day (QID) | ORAL | 0 refills | Status: DC | PRN

## 2021-01-28 MED ORDER — ORAL CARE MOUTH RINSE: 15.0000 mL | Freq: Once | OROMUCOSAL | Status: DC

## 2021-01-28 MED ORDER — DEXAMETHASONE SODIUM PHOSPHATE 4 MG/ML IJ SOLN
INTRAMUSCULAR | Status: DC | PRN
  Administered 2021-01-28: 5 mg via INTRAVENOUS

## 2021-01-28 MED ORDER — LIDOCAINE HCL (PF) 2 % IJ SOLN
INTRAMUSCULAR | Status: AC
  Filled 2021-01-28: qty 5

## 2021-01-28 MED ORDER — MIDAZOLAM HCL 5 MG/5ML IJ SOLN
INTRAMUSCULAR | Status: DC | PRN
  Administered 2021-01-28: 2 mg via INTRAVENOUS

## 2021-01-28 MED ORDER — IBUPROFEN 800 MG PO TABS: 800.0000 mg | ORAL_TABLET | Freq: Three times a day (TID) | ORAL | 0 refills | Status: DC | PRN

## 2021-01-28 MED ORDER — LIDOCAINE HCL (CARDIAC) PF 100 MG/5ML IV SOSY
PREFILLED_SYRINGE | INTRAVENOUS | Status: DC | PRN
  Administered 2021-01-28: 60 mg via INTRAVENOUS

## 2021-01-28 MED ORDER — DEXAMETHASONE SODIUM PHOSPHATE 10 MG/ML IJ SOLN
INTRAMUSCULAR | Status: AC
  Filled 2021-01-28: qty 1

## 2021-01-28 MED ORDER — KETOROLAC TROMETHAMINE 30 MG/ML IJ SOLN
INTRAMUSCULAR | Status: DC | PRN
  Administered 2021-01-28: 30 mg via INTRAVENOUS

## 2021-01-28 MED ORDER — FENTANYL CITRATE (PF) 100 MCG/2ML IJ SOLN
25.0000 ug | INTRAMUSCULAR | Status: DC | PRN
  Administered 2021-01-28 (×2): 25 ug via INTRAVENOUS

## 2021-01-28 MED ORDER — SUGAMMADEX SODIUM 200 MG/2ML IV SOLN
INTRAVENOUS | Status: DC | PRN
  Administered 2021-01-28: 200 mg via INTRAVENOUS

## 2021-01-28 MED ORDER — PROMETHAZINE HCL 25 MG/ML IJ SOLN: 6.2500 mg | INTRAMUSCULAR | Status: DC | PRN

## 2021-01-28 MED ORDER — PROPOFOL 10 MG/ML IV BOLUS
INTRAVENOUS | Status: AC
  Filled 2021-01-28: qty 20

## 2021-01-28 MED ORDER — SCOPOLAMINE 1 MG/3DAYS TD PT72
1.0000 | MEDICATED_PATCH | Freq: Once | TRANSDERMAL | Status: DC
  Administered 2021-01-28: 1.5 mg via TRANSDERMAL

## 2021-01-28 SURGICAL SUPPLY — 34 items
ADH SKN CLS APL DERMABOND .7 (GAUZE/BANDAGES/DRESSINGS) ×1
BAG SPEC RTRVL LRG 6X4 10 (ENDOMECHANICALS)
CABLE HIGH FREQUENCY MONO STRZ (ELECTRODE) IMPLANT
DERMABOND ADVANCED (GAUZE/BANDAGES/DRESSINGS) ×1
DERMABOND ADVANCED .7 DNX12 (GAUZE/BANDAGES/DRESSINGS) ×1 IMPLANT
DRSG OPSITE POSTOP 3X4 (GAUZE/BANDAGES/DRESSINGS) ×2 IMPLANT
DURAPREP 26ML APPLICATOR (WOUND CARE) ×2 IMPLANT
GAUZE 4X4 16PLY ~~LOC~~+RFID DBL (SPONGE) ×2 IMPLANT
GLOVE SURG LTX SZ6.5 (GLOVE) ×2 IMPLANT
GLOVE SURG UNDER POLY LF SZ7 (GLOVE) ×8 IMPLANT
GOWN STRL REUS W/TWL LRG LVL3 (GOWN DISPOSABLE) ×8 IMPLANT
KIT TURNOVER CYSTO (KITS) ×2 IMPLANT
LIGASURE VESSEL 5MM BLUNT TIP (ELECTROSURGICAL) ×2 IMPLANT
MANIPULATOR UTERINE 4.5 ZUMI (MISCELLANEOUS) IMPLANT
NEEDLE INSUFFLATION 120MM (ENDOMECHANICALS) ×2 IMPLANT
NS IRRIG 500ML POUR BTL (IV SOLUTION) ×2 IMPLANT
PACK LAPAROSCOPY BASIN (CUSTOM PROCEDURE TRAY) ×2 IMPLANT
PACK TRENDGUARD 450 HYBRID PRO (MISCELLANEOUS) IMPLANT
PENCIL BUTTON HOLSTER BLD 10FT (ELECTRODE) ×2 IMPLANT
POUCH SPECIMEN RETRIEVAL 10MM (ENDOMECHANICALS) IMPLANT
PROTECTOR NERVE ULNAR (MISCELLANEOUS) ×2 IMPLANT
SCISSORS LAP 5X35 DISP (ENDOMECHANICALS) IMPLANT
SET IRRIG TUBING LAPAROSCOPIC (IRRIGATION / IRRIGATOR) IMPLANT
SET TUBE SMOKE EVAC HIGH FLOW (TUBING) ×2 IMPLANT
SUT VICRYL 0 UR6 27IN ABS (SUTURE) ×2 IMPLANT
SUT VICRYL 4-0 PS2 18IN ABS (SUTURE) ×2 IMPLANT
SYR 5ML LL (SYRINGE) ×2 IMPLANT
TOWEL OR 17X26 10 PK STRL BLUE (TOWEL DISPOSABLE) ×4 IMPLANT
TRAY FOLEY W/BAG SLVR 14FR LF (SET/KITS/TRAYS/PACK) ×2 IMPLANT
TRENDGUARD 450 HYBRID PRO PACK (MISCELLANEOUS)
TROCAR BLADELESS OPT 5 100 (ENDOMECHANICALS) ×2 IMPLANT
TROCAR XCEL NON-BLD 11X100MML (ENDOMECHANICALS) IMPLANT
TROCAR XCEL NON-BLD 5MMX100MML (ENDOMECHANICALS) ×4 IMPLANT
WARMER LAPAROSCOPE (MISCELLANEOUS) ×2 IMPLANT

## 2021-01-28 NOTE — Anesthesia Preprocedure Evaluation (Addendum)
Anesthesia Evaluation  Patient identified by MRN, date of birth, ID band Patient awake    Reviewed: Allergy & Precautions, NPO status , Patient's Chart, lab work & pertinent test results  History of Anesthesia Complications Negative for: history of anesthetic complications  Airway Mallampati: II  TM Distance: >3 FB Neck ROM: Full    Dental  (+) Missing,    Pulmonary former smoker,    Pulmonary exam normal        Cardiovascular hypertension, Pt. on medications Normal cardiovascular exam     Neuro/Psych Anxiety negative neurological ROS     GI/Hepatic negative GI ROS, Neg liver ROS,   Endo/Other  negative endocrine ROS  Renal/GU negative Renal ROS  negative genitourinary   Musculoskeletal negative musculoskeletal ROS (+)   Abdominal   Peds  Hematology negative hematology ROS (+)   Anesthesia Other Findings Day of surgery medications reviewed with patient.  Reproductive/Obstetrics negative OB ROS                            Anesthesia Physical Anesthesia Plan  ASA: 2  Anesthesia Plan: General   Post-op Pain Management:    Induction: Intravenous  PONV Risk Score and Plan: 4 or greater and Scopolamine patch - Pre-op, Midazolam, Dexamethasone, Ondansetron and Treatment may vary due to age or medical condition  Airway Management Planned: Oral ETT  Additional Equipment: None  Intra-op Plan:   Post-operative Plan: Extubation in OR  Informed Consent: I have reviewed the patients History and Physical, chart, labs and discussed the procedure including the risks, benefits and alternatives for the proposed anesthesia with the patient or authorized representative who has indicated his/her understanding and acceptance.     Dental advisory given  Plan Discussed with: CRNA  Anesthesia Plan Comments:        Anesthesia Quick Evaluation

## 2021-01-28 NOTE — H&P (Signed)
Meghan Shaw is an 40 y.o. female presenting for scheduled surgery. Patient is G0 LMP 01/20/21 female who desires permanent tubal sterilization. Married for 10 years, No plans for future fertility. Her and husband have always felt that way, has thought about this a long time and is 100% certain she does not ever want a pregnancy Periods before OCP were regular and would like to be able to stop OCP once tubal done. Previously discussed risks/benefits of performing salpingectomy versus ligation and has opted for bilateral salpingectomy. No previous surgeries  Mild HTN well managed on HCTZ and Anxiety well managed on Fluoxetine by PCP- seen this year including normal labs  Up to date on AEX 11/23/20 including normal Pap   Menstrual History: Patient's last menstrual period was 01/20/2021 (approximate).    Past Medical History:  Diagnosis Date   Anxiety    Hypertension     Past Surgical History:  Procedure Laterality Date   BIOPSY THYROID  10/11/2019   benign   BREAST BIOPSY Left 10/2017   x2   benign    Family History  Problem Relation Age of Onset   Breast cancer Mother    Thyroid disease Neg Hx     Social History:  reports that she quit smoking about 10 years ago. Her smoking use included cigarettes. She has never used smokeless tobacco. She reports current alcohol use of about 2.0 standard drinks per week. She reports that she does not use drugs.  Allergies: No Known Allergies  Medications Prior to Admission  Medication Sig Dispense Refill Last Dose   acetaminophen (TYLENOL) 500 MG tablet Take 1,000 mg by mouth every 6 (six) hours as needed.   01/26/2021   FLUoxetine (PROZAC) 40 MG capsule Take 1 capsule (40 mg total) by mouth daily. 90 capsule 3 01/28/2021 at 0630   hydrochlorothiazide (HYDRODIURIL) 25 MG tablet TAKE 1 TABLET (25 MG TOTAL) BY MOUTH DAILY. 90 tablet 0 01/28/2021 at 0630   Multiple Vitamins-Minerals (MULTIVITAMIN WITH MINERALS) tablet Take 1 tablet by mouth daily.    01/28/2021 at 0630    Review of Systems  All other systems reviewed and are negative.  Blood pressure (!) 138/104, pulse (!) 117, temperature 98.6 F (37 C), temperature source Oral, resp. rate 14, height 5' (1.524 m), weight 60.6 kg, last menstrual period 01/20/2021, SpO2 100 %. Physical Exam Vitals reviewed.  Constitutional:      Appearance: Normal appearance.  HENT:     Head: Normocephalic.  Cardiovascular:     Rate and Rhythm: Normal rate.  Pulmonary:     Effort: Pulmonary effort is normal.  Abdominal:     General: Abdomen is flat.     Palpations: Abdomen is soft.  Genitourinary:    General: Normal vulva.  Musculoskeletal:        General: Normal range of motion.  Skin:    General: Skin is warm and dry.  Neurological:     General: No focal deficit present.     Mental Status: She is alert.  Psychiatric:        Mood and Affect: Mood normal.        Behavior: Behavior normal.    Results for orders placed or performed during the hospital encounter of 01/28/21 (from the past 24 hour(s))  Pregnancy, urine POC     Status: None   Collection Time: 01/28/21 11:35 AM  Result Value Ref Range   Preg Test, Ur NEGATIVE NEGATIVE    Assessment/Plan:  40Y G0 LMP 01/20/21 desires permanent  tubal sterilization consented for laparoscopic bilateral salpingectomy  Patient has been appropriately counseled on multiple visits regarding risks/benefits/alternatives to salpingectomy. She is 100% certain she does not desire future fertility and is aware of risk of regret. She is aware of risks for bleeding, infection, and damage to surrounding organs. She is aware of alternative contraceptive options including LARC's and declines these options. Patient consents signed for laparoscopic bilateral salpingectomy.  Routine preop care UPT NEG No antibiotics indicated SCD VTE ppx Routine postop care, anticipate DC home today DC instructions reviewed with the patient and will follow up in office for  routine postop visit in 2 weeks  Brittlyn Cloe A Leighton Luster 01/28/2021, 12:35 PM

## 2021-01-28 NOTE — Discharge Instructions (Signed)

## 2021-01-28 NOTE — Transfer of Care (Signed)
Immediate Anesthesia Transfer of Care Note  Patient: Meghan Shaw  Procedure(s) Performed: Procedure(s) (LRB): LAPAROSCOPIC TUBAL LIGATION By Salpingectomy (Bilateral)  Patient Location: PACU  Anesthesia Type: General  Level of Consciousness: awake, sedated, patient cooperative and responds to stimulation  Airway & Oxygen Therapy: Patient Spontanous Breathing and Patient connected to [place] 02 and soft FM   Post-op Assessment: Report given to PACU RN, Post -op Vital signs reviewed and stable and Patient moving all extremities  Post vital signs: Reviewed and stable  Complications: No apparent anesthesia complications

## 2021-01-28 NOTE — Anesthesia Postprocedure Evaluation (Signed)
Anesthesia Post Note  Patient: Kandis Cocking  Procedure(s) Performed: LAPAROSCOPIC TUBAL LIGATION By Salpingectomy (Bilateral: Abdomen)     Patient location during evaluation: PACU Anesthesia Type: General Level of consciousness: awake and alert and oriented Pain management: pain level controlled Vital Signs Assessment: post-procedure vital signs reviewed and stable Respiratory status: spontaneous breathing, nonlabored ventilation and respiratory function stable Cardiovascular status: blood pressure returned to baseline Postop Assessment: no apparent nausea or vomiting Anesthetic complications: no   No notable events documented.  Last Vitals:  Vitals:   01/28/21 1530 01/28/21 1531  BP:  (!) 111/91  Pulse: (!) 101 84  Resp: 16 10  Temp: 36.9 C 36.9 C  SpO2: 91% 96%    Last Pain:  Vitals:   01/28/21 1531  TempSrc:   PainSc: 1                  Shanda Howells

## 2021-01-28 NOTE — Anesthesia Procedure Notes (Signed)
Procedure Name: Intubation Date/Time: 01/28/2021 1:51 PM Performed by: Justice Rocher, CRNA Pre-anesthesia Checklist: Patient identified, Emergency Drugs available, Suction available, Patient being monitored and Timeout performed Patient Re-evaluated:Patient Re-evaluated prior to induction Oxygen Delivery Method: Circle system utilized Preoxygenation: Pre-oxygenation with 100% oxygen Induction Type: IV induction Ventilation: Mask ventilation without difficulty Laryngoscope Size: Mac and 3 Grade View: Grade I Tube type: Oral Tube size: 7.0 mm Number of attempts: 1 Airway Equipment and Method: Stylet and Oral airway Placement Confirmation: ETT inserted through vocal cords under direct vision, positive ETCO2, breath sounds checked- equal and bilateral and CO2 detector Secured at: 22 cm Tube secured with: Tape Dental Injury: Teeth and Oropharynx as per pre-operative assessment

## 2021-01-28 NOTE — Op Note (Signed)
Meghan Shaw 1981-02-13 [id number]  Operative Note  PROCEDURE: laparoscopic bilateral salpingectomy  PRE-OPERATIVE DIAGNOSIS: desires permanent female sterilization  POST-OPERATIVE DIAGNOSIS: desires permanent female sterilization  SURGEON: Dr. Clance Boll, DO  ASSISTANT: Arlan Organ, CNM  FINDINGS: pelvic exam reveals normal external female genitalia, normal appearing ectocervix without lesions, laparoscopy reveals normal female pelvic anatomy including small mobile uterus with smooth contours, normal appearing bilateral fallopian tubes and ovaries, general survey of the abdomen including liver edge grossly normal appearing  SPECIMENS: bilateral fallopian tubes  EBL: minimal  FLUIDS: 1,000 cc crystalloids  UOP: 100 cc clear  COMPLICATIONS: None  PROCEDURE IN DETAIL:   After the patient was appropriately consented in the holding area, she was taken to the operating room where general anesthesia was administered without complications. The patient was placed in the dorsal lithotomy position. Bilateral arms were tucked with the appropriate barrier padding. The patient was prepped and draped in the usual sterile fashion. The bladder was trained with a catheter. An appropriate time out was performed that verified the correct patient, procedure, and surgical team.   Pelvic exam performed. A sterile speculum was inserted into the vagina and the cervix was visualized. A sterile sponge on a stick was placed into the vagina for uterine manipulation. Attention was turned to the abdomen.  A scalpel was used to make a small vertical skin incision just inferior to the umbilicus. The Veress needle was advanced through the skin incision until 2 clicks were appreciated. The gas was connected and turned on and a low opening pressure was noted. Pneumoperitoneum was achieved without complications. A 5 mm laparoscope and trocar sleeve was inserted through the umbilical incision and the abdomen was  entered under direct visualization. Inspection of the abdomen revealed the findings as noted above and no obvious injuries noted. The patient was placed in Trendelenburg positioning to facilitate pelvic visualization. Lower quadrant ports were placed bilaterally at points approximately 2 cm superior and 2 cm medial to the ASIS. 5 mm trocars were placed bilaterally under direct laparoscopic visualization. Starting on the left side, the LigaSure device was used to sequentially cauterize and cut the mesosalpinx just inferior to the fallopian tube and transecting the fallopian tube medially at the cornual region. Attention was then turned to the right and transection performed in a similar fashion. The fallopian tubes were removed through the lateral ports and sent for final surgical pathology. Excellent hemostasis was noted at the surgical site. The pressure was reduced and the patient laid flat and continued hemostasis noted. The gas was released from the abdomen. The skin incisions were closed with 4-0 Vicryl and skin glue. Local anesthetic using 0.25% Marcaine was injected at the incision sites. The uterine manipulator was removed. The patient tolerated the procedure well and was taken to the recovery room in stable condition. All lap, needle, and instrument counts were correct.  [name] A Toney Difatta 01/28/21 2:43 PM

## 2021-01-28 NOTE — Progress Notes (Signed)
Pt's husband called in stating that RXs had not been received by pharmacy. Reviewed chart and found printed prescription had been left with the chart and not given to pt. Called Dr. Conni Elliot and she is going to resend scripts electronically to [facility] [address] Called patient's husband back and he is aware that scripts should be available for pickup later this evening. Hard copy RXs shredded.

## 2021-01-29 ENCOUNTER — Encounter (HOSPITAL_BASED_OUTPATIENT_CLINIC_OR_DEPARTMENT_OTHER): Payer: Self-pay | Admitting: Obstetrics and Gynecology

## 2021-02-01 LAB — SURGICAL PATHOLOGY

## 2021-02-19 DIAGNOSIS — R238 Other skin changes: Secondary | ICD-10-CM | POA: Diagnosis not present

## 2021-02-24 ENCOUNTER — Other Ambulatory Visit: Payer: Self-pay | Admitting: Family Medicine

## 2021-03-08 IMAGING — MG DIGITAL SCREENING BILAT W/ TOMO W/ CAD
6 of 10 series · 6 of 30 positions shown · non-contrast
Comparison: Previous exam(s).

CLINICAL DATA: Screening.

EXAM:
DIGITAL SCREENING BILATERAL MAMMOGRAM WITH TOMO AND CAD

[R MLO synth-2D (1 of 2)]
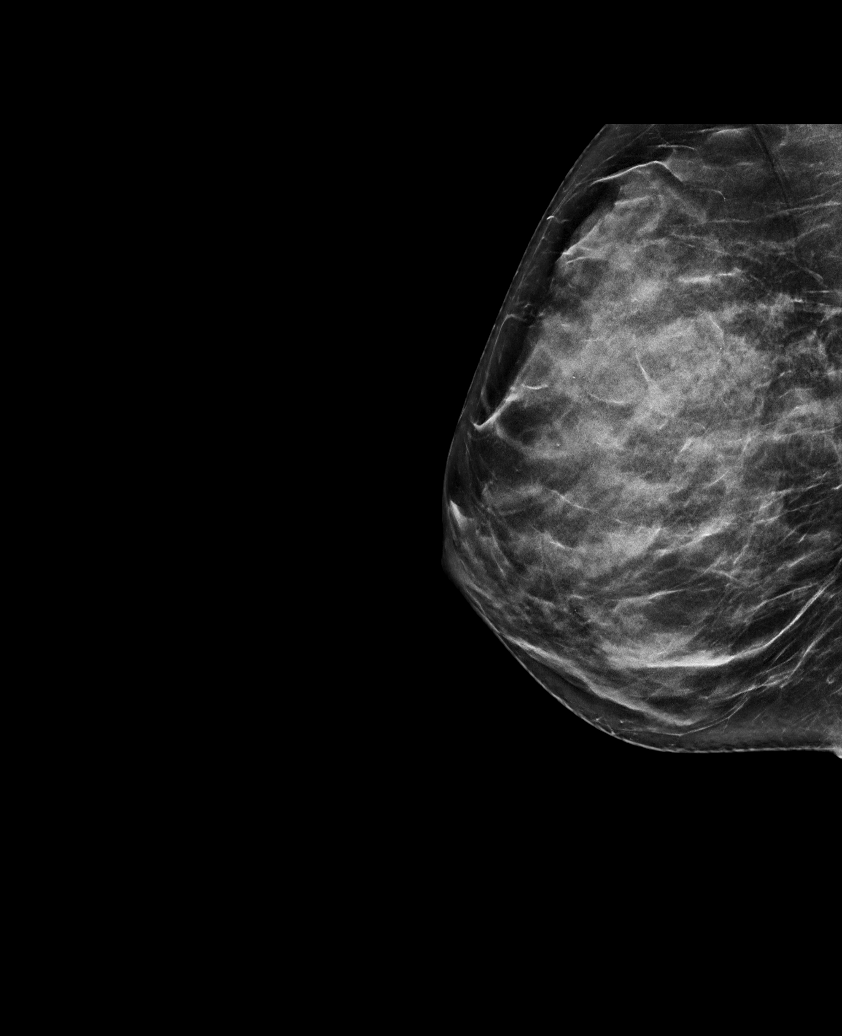

[R MLO synth-2D (2 of 2)]
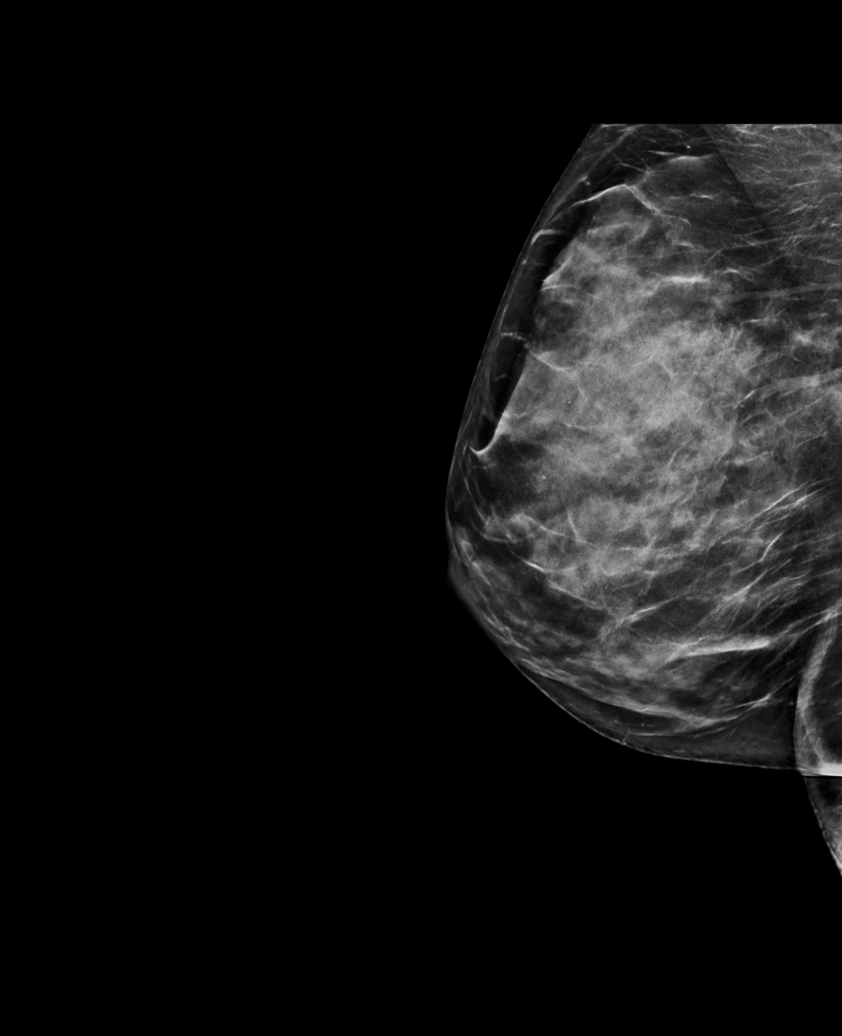

[R CC synth-2D]
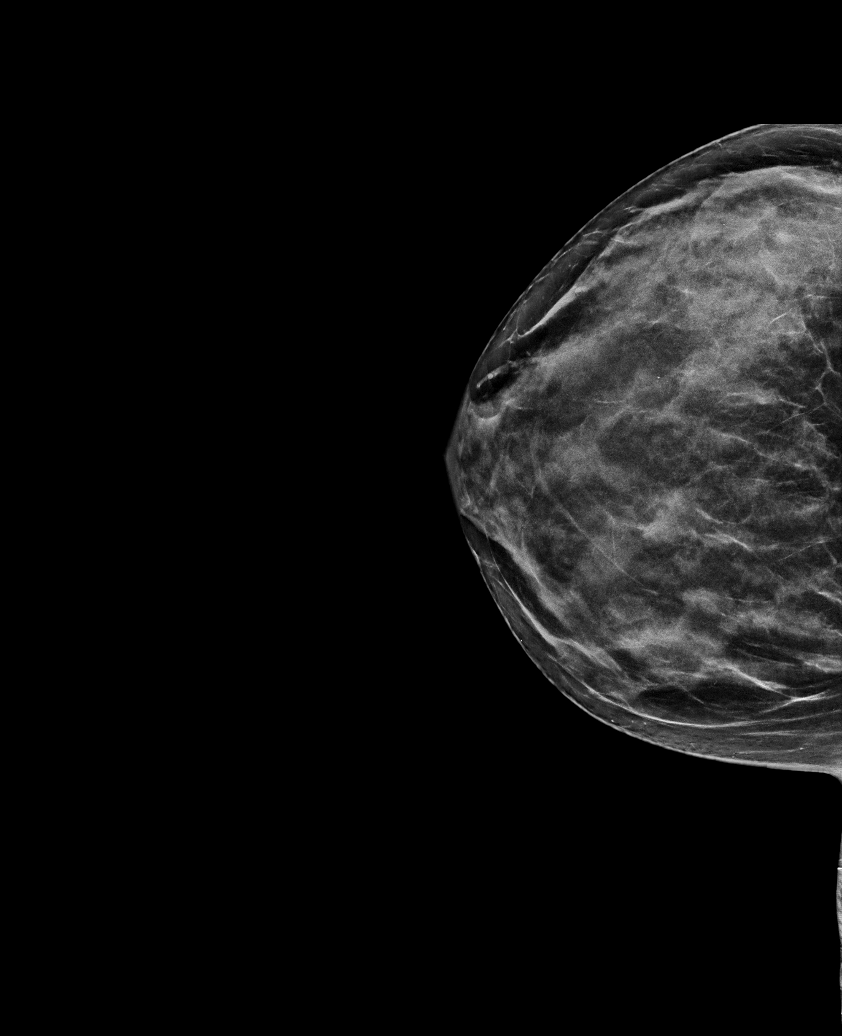

[L MLO synth-2D]
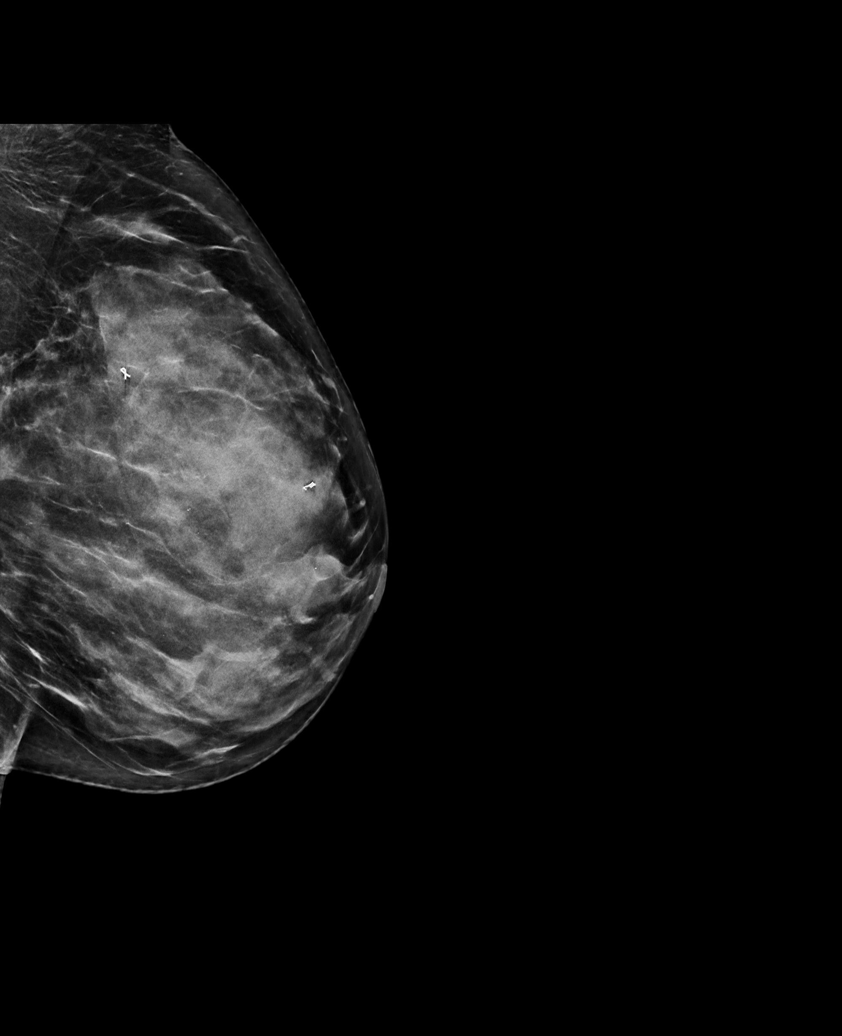

[L CC synth-2D]
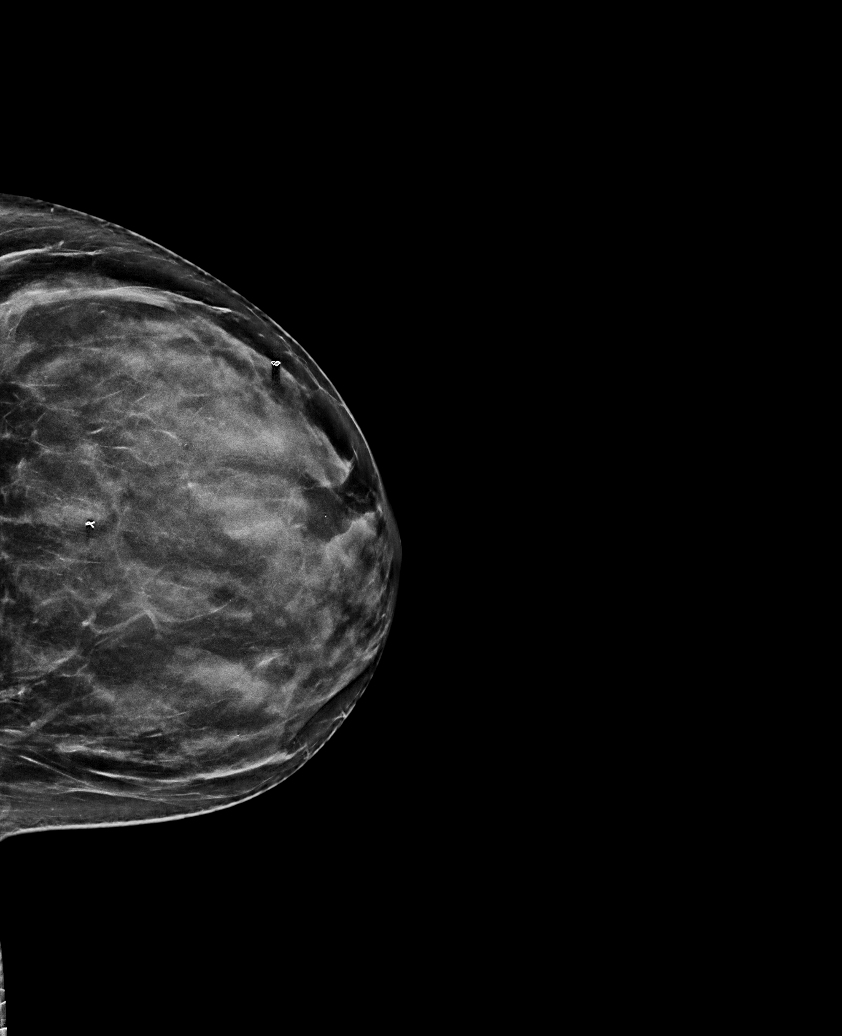

[R CC tomo · tomo slice 30/59.0]
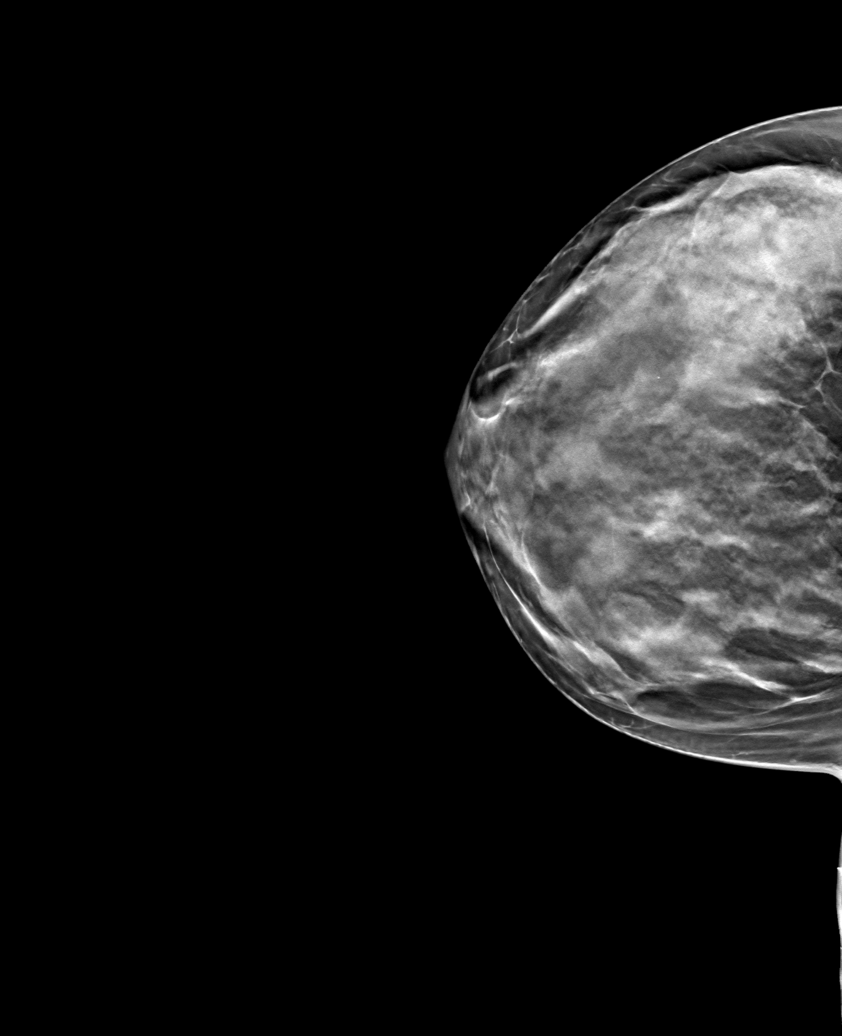

[6 of 30 positions shown; findings below may reference images not displayed]

ACR Breast Density Category d: The breast tissue is extremely dense,
which lowers the sensitivity of mammography
FINDINGS: There are no findings suspicious for malignancy. Images were
processed with CAD.
IMPRESSION: No mammographic evidence of malignancy. A result letter of this
screening mammogram will be mailed directly to the patient.

RECOMMENDATION:
Screening mammogram at age 41. (Code:[id number])

BI-RADS CATEGORY  1: Negative.

## 2021-04-08 ENCOUNTER — Other Ambulatory Visit: Payer: Self-pay | Admitting: Family Medicine

## 2021-05-28 ENCOUNTER — Other Ambulatory Visit: Payer: Self-pay | Admitting: Family Medicine

## 2021-05-31 ENCOUNTER — Other Ambulatory Visit: Payer: Self-pay

## 2021-05-31 ENCOUNTER — Ambulatory Visit (INDEPENDENT_AMBULATORY_CARE_PROVIDER_SITE_OTHER): Payer: BC Managed Care – PPO | Admitting: Family

## 2021-05-31 ENCOUNTER — Encounter: Payer: Self-pay | Admitting: Family

## 2021-05-31 VITALS — BP 118/60 | HR 119 | Temp 98.3°F | Ht 60.0 in | Wt 144.0 lb

## 2021-05-31 DIAGNOSIS — I10 Essential (primary) hypertension: Secondary | ICD-10-CM

## 2021-05-31 DIAGNOSIS — F411 Generalized anxiety disorder: Secondary | ICD-10-CM

## 2021-05-31 MED ORDER — FLUOXETINE HCL 40 MG PO CAPS: 40.0000 mg | ORAL_CAPSULE | Freq: Every day | ORAL | 1 refills | Status: DC

## 2021-05-31 MED ORDER — HYDROCHLOROTHIAZIDE 25 MG PO TABS: 25.0000 mg | ORAL_TABLET | Freq: Every day | ORAL | 1 refills | Status: DC

## 2021-05-31 NOTE — Progress Notes (Addendum)
Subjective:     Patient ID: Meghan Shaw, female    DOB: 24-Apr-1981, 40 y.o.   MRN: [id number]  Chief Complaint  Patient presents with   Annual Exam   Hypertension   Anxiety    HPI: Hypertension: Patient is currently maintained on the following medications for blood pressure: HCTZ Patient reports good compliance with blood pressure medications. Patient denies chest pain, shortness of breath or swelling. Last 3 blood pressure readings in our office are as follows: BP Readings from Last 3 Encounters:  05/31/21 118/60  01/28/21 121/83  01/26/21 (!) 148/101   Anxiety/Depression: Patient complains of anxiety disorder.   She has the following symptoms: none.  Onset of symptoms was approximately 2 years ago, She denies current suicidal and homicidal ideation.  Possible organic causes contributing are: none.  Risk factors: none  Previous treatment includes Prozac.  She complains of the following side effects from the treatment: none. Depression screen PHQ 2/9 05/31/2021  Decreased Interest 0  Down, Depressed, Hopeless 0  PHQ - 2 Score 0  Altered sleeping -  Tired, decreased energy -  Change in appetite -  Feeling bad or failure about yourself  -  Trouble concentrating -  Moving slowly or fidgety/restless -  Suicidal thoughts -  PHQ-9 Score -  Difficult doing work/chores -    GAD 7 : Generalized Anxiety Score 05/31/2021  Nervous, Anxious, on Edge 3  Control/stop worrying 2  Worry too much - different things 2  Trouble relaxing 2  Restless 1  Easily annoyed or irritable 1  Afraid - awful might happen 2  Total GAD 7 Score 13  Anxiety Difficulty -     Health Maintenance Due  Topic Date Due   Hepatitis C Screening  Never done   PAP SMEAR-Modifier  Never done   COVID-19 Vaccine (3 - Booster for Pfizer series) 11/07/2019    Past Medical History:  Diagnosis Date   Anxiety    Hypertension     Past Surgical History:  Procedure Laterality Date   BIOPSY THYROID   10/11/2019   benign   BREAST BIOPSY Left 10/2017   x2   benign   LAPAROSCOPIC TUBAL LIGATION Bilateral 01/28/2021   Procedure: LAPAROSCOPIC TUBAL LIGATION By Salpingectomy;  Surgeon: Toy Baker, DO;  Location: [facility];  Service: Gynecology;  Laterality: Bilateral;    Outpatient Medications Prior to Visit  Medication Sig Dispense Refill   Multiple Vitamins-Minerals (MULTIVITAMIN WITH MINERALS) tablet Take 1 tablet by mouth daily.     FLUoxetine (PROZAC) 40 MG capsule TAKE 1 CAPSULE (40 MG TOTAL) BY MOUTH DAILY. 90 capsule 0   hydrochlorothiazide (HYDRODIURIL) 25 MG tablet TAKE 1 TABLET (25 MG TOTAL) BY MOUTH DAILY. 90 tablet 0   acetaminophen (TYLENOL) 500 MG tablet Take 1,000 mg by mouth every 6 (six) hours as needed. (Patient not taking: Reported on 05/31/2021)     ibuprofen (ADVIL) 800 MG tablet Take 1 tablet (800 mg total) by mouth every 8 (eight) hours as needed. (Patient not taking: Reported on 05/31/2021) 30 tablet 0   oxyCODONE (OXY IR/ROXICODONE) 5 MG immediate release tablet Take 1 tablet (5 mg total) by mouth every 6 (six) hours as needed for severe pain. (Patient not taking: Reported on 05/31/2021) 15 tablet 0   No facility-administered medications prior to visit.    No Known Allergies      Objective:    Physical Exam Vitals and nursing note reviewed.  Constitutional:  Appearance: Normal appearance.  Cardiovascular:     Rate and Rhythm: Normal rate and regular rhythm.  Pulmonary:     Effort: Pulmonary effort is normal.     Breath sounds: Normal breath sounds.  Musculoskeletal:        General: Normal range of motion.  Skin:    General: Skin is warm and dry.  Neurological:     Mental Status: She is alert.  Psychiatric:        Mood and Affect: Mood normal.        Behavior: Behavior normal.    BP 118/60    Pulse (!) 119    Temp 98.3 F (36.8 C) (Temporal)    Ht 5' (1.524 m)    Wt 144 lb (65.3 kg)    LMP 05/07/2021 (Approximate)    SpO2  98%    BMI 28.12 kg/m  Wt Readings from Last 3 Encounters:  05/31/21 144 lb (65.3 kg)  01/28/21 133 lb 11.2 oz (60.6 kg)  01/26/21 135 lb (61.2 kg)       Assessment & Plan:   Problem List Items Addressed This Visit       Cardiovascular and Mediastinum   HTN (hypertension)   Relevant Medications   hydrochlorothiazide (HYDRODIURIL) 25 MG tablet     Other   GAD (generalized anxiety disorder) - Primary   Relevant Medications   FLUoxetine (PROZAC) 40 MG capsule    Meds ordered this encounter  Medications   FLUoxetine (PROZAC) 40 MG capsule    Sig: Take 1 capsule (40 mg total) by mouth daily.    Dispense:  90 capsule    Refill:  1    Order Specific Question:   Supervising Provider    Answer:   ANDY, CAMILLE L [[id number]]   hydrochlorothiazide (HYDRODIURIL) 25 MG tablet    Sig: Take 1 tablet (25 mg total) by mouth daily.    Dispense:  90 tablet    Refill:  1    Pt needs to establish care with new PCP. Provider no longer at facility.    Order Specific Question:   Supervising Provider    Answer:   ANDY, CAMILLE L [[id number]]

## 2021-05-31 NOTE — Patient Instructions (Signed)
It was very nice to see you today!  I have sent your refills to your pharmacy, please schedule a 6 month follow up visit today.  Have a wonderful Christmas!   PLEASE NOTE:  If you had any lab tests please let us know if you have not heard back within a few days. You may see your results on MyChart before we have a chance to review them but we will give you a call once they are reviewed by [place]. If we ordered any referrals today, please let us know if you have not heard from their office within the next week.   Please try these tips to maintain a healthy lifestyle:  Eat most of your calories during the day when you are active. Eliminate processed foods including packaged sweets (pies, cakes, cookies), reduce intake of potatoes, white bread, white pasta, and white rice. Look for whole grain options, oat flour or almond flour.  Each meal should contain half fruits/vegetables, one quarter protein, and one quarter carbs (no bigger than a computer mouse).  Cut down on sweet beverages. This includes juice, soda, and sweet tea. Also watch fruit intake, though this is a healthier sweet option, it still contains natural sugar! Limit to 3 servings daily.  Drink at least 1 glass of water with each meal and aim for at least 8 glasses per day  Exercise at least 150 minutes every week.

## 2021-06-09 ENCOUNTER — Encounter: Payer: BC Managed Care – PPO | Admitting: Family

## 2021-09-21 ENCOUNTER — Telehealth: Payer: Self-pay | Admitting: Family

## 2021-09-21 NOTE — Telephone Encounter (Signed)
I called and spoke with patient.

## 2021-09-21 NOTE — Telephone Encounter (Signed)
Pt has sch 6 month fu from [facility] 12/22- pt would like to know should she come back for labs before this appointment?  ?

## 2021-11-16 ENCOUNTER — Encounter: Payer: Self-pay | Admitting: Family

## 2021-11-16 ENCOUNTER — Ambulatory Visit (INDEPENDENT_AMBULATORY_CARE_PROVIDER_SITE_OTHER): Payer: BC Managed Care – PPO | Admitting: Family

## 2021-11-16 VITALS — BP 135/92 | HR 97 | Temp 97.9°F | Ht 60.0 in | Wt 138.5 lb

## 2021-11-16 DIAGNOSIS — Z1231 Encounter for screening mammogram for malignant neoplasm of breast: Secondary | ICD-10-CM

## 2021-11-16 DIAGNOSIS — I10 Essential (primary) hypertension: Secondary | ICD-10-CM

## 2021-11-16 DIAGNOSIS — F411 Generalized anxiety disorder: Secondary | ICD-10-CM | POA: Diagnosis not present

## 2021-11-16 MED ORDER — HYDROCHLOROTHIAZIDE 25 MG PO TABS: 25.0000 mg | ORAL_TABLET | Freq: Every day | ORAL | 1 refills | Status: DC

## 2021-11-16 MED ORDER — FLUOXETINE HCL 40 MG PO CAPS: 40.0000 mg | ORAL_CAPSULE | Freq: Every day | ORAL | 1 refills | Status: DC

## 2021-11-16 NOTE — Progress Notes (Signed)
Subjective:     Patient ID: Meghan Shaw, female    DOB: 1980/06/19, 41 y.o.   MRN: [id number]  Chief Complaint  Patient presents with   Anxiety    Pt states Anxiety has been good. Refill   Hypertension    Refill, Pt BP  at home has been 120's/90's.    HPI: Hypertension: Patient is currently maintained on the following medications for blood pressure: HCTZ Patient reports good compliance with blood pressure medications. Patient denies chest pain, shortness of breath or swelling. Anxiety/Depression: Patient complains of anxiety disorder.   She has the following symptoms: none.  Onset of symptoms was approximately 2 years ago, She denies current suicidal and homicidal ideation.  Possible organic causes contributing are: none.  Risk factors: none  Previous treatment includes Prozac.  She complains of the following side effects from the treatment: none.  Assessment & Plan:   Problem List Items Addressed This Visit       Cardiovascular and Mediastinum   HTN (hypertension)    Chronic - stable on HCTZ, refill sent, f/u in 6 mos.       Relevant Medications   hydrochlorothiazide (HYDRODIURIL) 25 MG tablet     Other   GAD (generalized anxiety disorder)    Chronic- stable on Prozac, refill sent, f/u in 6 mos.       Relevant Medications   FLUoxetine (PROZAC) 40 MG capsule   Other Visit Diagnoses     Breast cancer screening by mammogram    -  Primary   Relevant Orders   MM Digital Diagnostic Bilat       Outpatient Medications Prior to Visit  Medication Sig Dispense Refill   Multiple Vitamins-Minerals (MULTIVITAMIN WITH MINERALS) tablet Take 1 tablet by mouth daily.     FLUoxetine (PROZAC) 40 MG capsule Take 1 capsule (40 mg total) by mouth daily. 90 capsule 1   hydrochlorothiazide (HYDRODIURIL) 25 MG tablet Take 1 tablet (25 mg total) by mouth daily. 90 tablet 1   No facility-administered medications prior to visit.    Past Medical History:  Diagnosis Date    Anxiety    Hypertension     Past Surgical History:  Procedure Laterality Date   BIOPSY THYROID  10/11/2019   benign   BREAST BIOPSY Left 10/2017   x2   benign   LAPAROSCOPIC TUBAL LIGATION Bilateral 01/28/2021   Procedure: LAPAROSCOPIC TUBAL LIGATION By Salpingectomy;  Surgeon: Toy Baker, DO;  Location: [facility];  Service: Gynecology;  Laterality: Bilateral;    No Known Allergies     Objective:    Physical Exam Vitals and nursing note reviewed.  Constitutional:      Appearance: Normal appearance.  Cardiovascular:     Rate and Rhythm: Normal rate and regular rhythm.  Pulmonary:     Effort: Pulmonary effort is normal.     Breath sounds: Normal breath sounds.  Musculoskeletal:        General: Normal range of motion.  Skin:    General: Skin is warm and dry.  Neurological:     Mental Status: She is alert.  Psychiatric:        Mood and Affect: Mood normal.        Behavior: Behavior normal.    BP (!) 135/92 (BP Location: Left Arm, Patient Position: Sitting, Cuff Size: Normal)   Pulse 97   Temp 97.9 F (36.6 C) (Temporal)   Ht 5' (1.524 m)   Wt 138 lb 8 oz (62.8  kg)   LMP 11/15/2021 (Exact Date)   SpO2 100%   BMI 27.05 kg/m  Wt Readings from Last 3 Encounters:  11/16/21 138 lb 8 oz (62.8 kg)  05/31/21 144 lb (65.3 kg)  01/28/21 133 lb 11.2 oz (60.6 kg)      Meds ordered this encounter  Medications   FLUoxetine (PROZAC) 40 MG capsule    Sig: Take 1 capsule (40 mg total) by mouth daily.    Dispense:  90 capsule    Refill:  1   hydrochlorothiazide (HYDRODIURIL) 25 MG tablet    Sig: Take 1 tablet (25 mg total) by mouth daily.    Dispense:  90 tablet    Refill:  1    Pt needs to establish care with new PCP. Provider no longer at facility.    Dulce Sellar, NP

## 2021-11-16 NOTE — Assessment & Plan Note (Signed)
Chronic- stable on Prozac, refill sent, f/u in 6 mos.

## 2021-11-16 NOTE — Assessment & Plan Note (Signed)
Chronic - stable on HCTZ, refill sent, f/u in 6 mos.

## 2021-11-22 ENCOUNTER — Ambulatory Visit: Payer: BC Managed Care – PPO | Admitting: Family

## 2022-01-24 ENCOUNTER — Ambulatory Visit: Payer: BC Managed Care – PPO

## 2022-02-03 ENCOUNTER — Ambulatory Visit
Admission: RE | Admit: 2022-02-03 | Discharge: 2022-02-03 | Disposition: A | Discharge status: Home / Self Care | Payer: BC Managed Care – PPO | Source: Ambulatory Visit | Attending: Family | Admitting: Family

## 2022-02-03 DIAGNOSIS — Z1231 Encounter for screening mammogram for malignant neoplasm of breast: Secondary | ICD-10-CM

## 2022-02-07 ENCOUNTER — Other Ambulatory Visit: Payer: Self-pay | Admitting: Family

## 2022-02-07 DIAGNOSIS — R928 Other abnormal and inconclusive findings on diagnostic imaging of breast: Secondary | ICD-10-CM

## 2022-02-18 ENCOUNTER — Ambulatory Visit
Admission: RE | Admit: 2022-02-18 | Discharge: 2022-02-18 | Disposition: A | Discharge status: Home / Self Care | Payer: BC Managed Care – PPO | Source: Ambulatory Visit | Attending: Family | Admitting: Family

## 2022-02-18 ENCOUNTER — Ambulatory Visit: Admission: RE | Admit: 2022-02-18 | Payer: BC Managed Care – PPO | Source: Ambulatory Visit

## 2022-02-18 DIAGNOSIS — R928 Other abnormal and inconclusive findings on diagnostic imaging of breast: Secondary | ICD-10-CM

## 2022-02-18 DIAGNOSIS — R922 Inconclusive mammogram: Secondary | ICD-10-CM | POA: Diagnosis not present

## 2022-02-20 NOTE — Progress Notes (Signed)
Mammogram normal! Recheck in 1 year.

## 2022-03-07 ENCOUNTER — Encounter: Payer: Self-pay | Admitting: *Deleted

## 2022-05-26 ENCOUNTER — Encounter: Payer: Self-pay | Admitting: *Deleted

## 2022-06-18 ENCOUNTER — Other Ambulatory Visit: Payer: Self-pay | Admitting: Family

## 2022-06-18 DIAGNOSIS — I10 Essential (primary) hypertension: Secondary | ICD-10-CM

## 2022-07-07 ENCOUNTER — Encounter: Payer: Self-pay | Admitting: Family

## 2022-07-07 ENCOUNTER — Ambulatory Visit (INDEPENDENT_AMBULATORY_CARE_PROVIDER_SITE_OTHER): Payer: BC Managed Care – PPO | Admitting: Family

## 2022-07-07 VITALS — BP 120/88 | HR 131 | Temp 97.7°F | Ht 60.0 in | Wt 140.2 lb

## 2022-07-07 DIAGNOSIS — I10 Essential (primary) hypertension: Secondary | ICD-10-CM

## 2022-07-07 DIAGNOSIS — Z0001 Encounter for general adult medical examination with abnormal findings: Secondary | ICD-10-CM | POA: Diagnosis not present

## 2022-07-07 DIAGNOSIS — F411 Generalized anxiety disorder: Secondary | ICD-10-CM | POA: Diagnosis not present

## 2022-07-07 DIAGNOSIS — Z1211 Encounter for screening for malignant neoplasm of colon: Secondary | ICD-10-CM

## 2022-07-07 LAB — CBC WITH DIFFERENTIAL/PLATELET
Basophils Absolute: 0.1 10*3/uL (ref 0.0–0.1)
Basophils Relative: 0.7 % (ref 0.0–3.0)
Eosinophils Absolute: 0.3 10*3/uL (ref 0.0–0.7)
Eosinophils Relative: 3.7 % (ref 0.0–5.0)
HCT: 41.6 % (ref 36.0–46.0)
Hemoglobin: 14.5 g/dL (ref 12.0–15.0)
Lymphocytes Relative: 21.9 % (ref 12.0–46.0)
Lymphs Abs: 1.7 10*3/uL (ref 0.7–4.0)
MCHC: 34.8 g/dL (ref 30.0–36.0)
MCV: 90.2 fl (ref 78.0–100.0)
Monocytes Absolute: 0.5 10*3/uL (ref 0.1–1.0)
Monocytes Relative: 6.4 % (ref 3.0–12.0)
Neutro Abs: 5.2 10*3/uL (ref 1.4–7.7)
Neutrophils Relative %: 67.3 % (ref 43.0–77.0)
Platelets: 372 10*3/uL (ref 150.0–400.0)
RBC: 4.61 Mil/uL (ref 3.87–5.11)
RDW: 12.6 % (ref 11.5–15.5)
WBC: 7.8 10*3/uL (ref 4.0–10.5)

## 2022-07-07 LAB — LIPID PANEL
Cholesterol: 216 mg/dL — ABNORMAL HIGH (ref 0–200)
HDL: 51.9 mg/dL (ref >39.00)
LDL Cholesterol: 136 mg/dL — ABNORMAL HIGH (ref 0–99)
NonHDL: 164.14
Total CHOL/HDL Ratio: 4
Triglycerides: 139 mg/dL (ref 0.0–149.0)
VLDL: 27.8 mg/dL (ref 0.0–40.0)

## 2022-07-07 LAB — COMPREHENSIVE METABOLIC PANEL
ALT: 17 U/L (ref 0–35)
AST: 15 U/L (ref 0–37)
Albumin: 4.8 g/dL (ref 3.5–5.2)
Alkaline Phosphatase: 86 U/L (ref 39–117)
BUN: 19 mg/dL (ref 6–23)
CO2: 28 mEq/L (ref 19–32)
Calcium: 10 mg/dL (ref 8.4–10.5)
Chloride: 99 mEq/L (ref 96–112)
Creatinine, Ser: 0.73 mg/dL (ref 0.40–1.20)
GFR: 102.79 mL/min (ref >60.00)
Glucose, Bld: 110 mg/dL — ABNORMAL HIGH (ref 70–99)
Potassium: 3.8 mEq/L (ref 3.5–5.1)
Sodium: 139 mEq/L (ref 135–145)
Total Bilirubin: 1.1 mg/dL (ref 0.2–1.2)
Total Protein: 7.9 g/dL (ref 6.0–8.3)

## 2022-07-07 LAB — TSH: TSH: 1.28 u[IU]/mL (ref 0.35–5.50)

## 2022-07-07 MED ORDER — HYDROXYZINE HCL 10 MG PO TABS: 10.0000 mg | ORAL_TABLET | Freq: Three times a day (TID) | ORAL | 0 refills | Status: DC | PRN

## 2022-07-07 MED ORDER — FLUOXETINE HCL 40 MG PO CAPS: 40.0000 mg | ORAL_CAPSULE | Freq: Every day | ORAL | 1 refills | Status: DC

## 2022-07-07 NOTE — Assessment & Plan Note (Signed)
chronic - stable taking HCTZ 25mg  qd sending refill f/u 6 mos

## 2022-07-07 NOTE — Patient Instructions (Addendum)
It was very nice to see you today!   I will review your lab results via MyChart in a few days.  As discussed, I have sent over Hydroxyzine to try for your intermittent anxiety attacks.  Let me know if this is helping, if not we can discuss other options. Continue the Prozac daily - refill has been sent . Continue the HCTZ daily, BP looks great. Try to work in exercise where able, shooting for 4-5 days per week, 20 minutes.         PLEASE NOTE:  If you had any lab tests please let us know if you have not heard back within a few days. You may see your results on MyChart before we have a chance to review them but we will give you a call once they are reviewed by [place]. If we ordered any referrals today, please let us know if you have not heard from their office within the next week.

## 2022-07-07 NOTE — Assessment & Plan Note (Signed)
chronic - unstable taking Prozac 40mg  qd, has been on for years, but reports having more anxiety lately discussed increasing Prozac vs adding prn med pt prefers to try prn, sending Hydroxyzine 10mg  tid prn advised to let me know if not helping sx f/u in 6 mos or prn

## 2022-07-07 NOTE — Progress Notes (Signed)
Phone [phone]  Subjective:   Patient is a 42 y.o. female presenting for annual physical.    Chief Complaint  Patient presents with   Annual Exam    Fasting w/ labs   Hypertension    Medication check   HPI: Hypertension: Patient is currently maintained on the following medications for blood pressure: HCTZ Patient reports good compliance with blood pressure medications. Patient denies chest pain, headaches, shortness of breath or swelling. Last 3 blood pressure readings in our office are as follows: BP Readings from Last 3 Encounters:  07/07/22 120/88  11/16/21 (!) 135/92  05/31/21 118/60    See problem oriented charting- ROS- full  review of systems was completed and negative except for: HTN noted in HPI above.  The following were reviewed and entered/updated in epic: Past Medical History:  Diagnosis Date   Anxiety    Hypertension    Patient Active Problem List   Diagnosis Date Noted   HTN (hypertension) 10/11/2019   Multiple thyroid nodules 09/18/2019   GAD (generalized anxiety disorder) 09/11/2019   Past Surgical History:  Procedure Laterality Date   BIOPSY THYROID  10/11/2019   benign   BREAST BIOPSY Left 10/2017   x2   benign   LAPAROSCOPIC TUBAL LIGATION Bilateral 01/28/2021   Procedure: LAPAROSCOPIC TUBAL LIGATION By Salpingectomy;  Surgeon: Armandina Stammer, DO;  Location: [facility];  Service: Gynecology;  Laterality: Bilateral;    Family History  Problem Relation Age of Onset   Breast cancer Mother        late 49s, 48   Breast cancer Paternal Grandmother 62   Thyroid disease Neg Hx     Medications- reviewed and updated Current Outpatient Medications  Medication Sig Dispense Refill   hydrochlorothiazide (HYDRODIURIL) 25 MG tablet TAKE 1 TABLET (25 MG TOTAL) BY MOUTH DAILY. 90 tablet 1   hydrOXYzine (ATARAX) 10 MG tablet Take 1 tablet (10 mg total) by mouth 3 (three) times daily as needed. 30 tablet 0   Multiple  Vitamins-Minerals (MULTIVITAMIN WITH MINERALS) tablet Take 1 tablet by mouth daily.     FLUoxetine (PROZAC) 40 MG capsule Take 1 capsule (40 mg total) by mouth daily. 90 capsule 1   No current facility-administered medications for this visit.    Allergies-reviewed and updated No Known Allergies  Social History   Social History Narrative   Not on file    Objective:  BP 120/88 (BP Location: Left Arm, Patient Position: Sitting, Cuff Size: Large)   Pulse (!) 131   Temp 97.7 F (36.5 C) (Temporal)   Ht 5' (1.524 m)   Wt 140 lb 4 oz (63.6 kg)   SpO2 99%   BMI 27.39 kg/m  Physical Exam Vitals and nursing note reviewed.  Constitutional:      Appearance: Normal appearance.  HENT:     Head: Normocephalic.     Right Ear: Tympanic membrane normal.     Left Ear: Tympanic membrane normal.     Nose: Nose normal.     Mouth/Throat:     Mouth: Mucous membranes are moist.  Eyes:     Pupils: Pupils are equal, round, and reactive to light.  Cardiovascular:     Rate and Rhythm: Normal rate and regular rhythm.  Pulmonary:     Effort: Pulmonary effort is normal.     Breath sounds: Normal breath sounds.  Musculoskeletal:        General: Normal range of motion.     Cervical back: Normal range of motion.  Lymphadenopathy:     Cervical: No cervical adenopathy.  Skin:    General: Skin is warm and dry.  Neurological:     Mental Status: She is alert.  Psychiatric:        Mood and Affect: Mood normal.        Behavior: Behavior normal.      Assessment and Plan   Health Maintenance counseling: 1. Anticipatory guidance: Patient counseled regarding regular dental exams q6 months, eye exams,  avoiding smoking and second hand smoke, limiting alcohol to 1 beverage per day, no illicit drugs.   2. Risk factor reduction:  Advised patient of need for regular exercise and diet rich with fruits and vegetables to reduce risk of heart attack and stroke. Exercise- none currently.  Wt Readings from  Last 3 Encounters:  07/07/22 140 lb 4 oz (63.6 kg)  11/16/21 138 lb 8 oz (62.8 kg)  05/31/21 144 lb (65.3 kg)   3. Immunizations/screenings/ancillary studies Immunization History  Administered Date(s) Administered   Influenza-Unspecified 03/26/2020   PFIZER(Purple Top)SARS-COV-2 Vaccination 08/17/2019, 09/12/2019   Health Maintenance Due  Topic Date Due   Hepatitis C Screening  Never done   DTaP/Tdap/Td (1 - Tdap) Never done    4. Cervical cancer screening- due later this year, sees GYN 5. Breast cancer screening-  mammogram - done 02/2022 6. Colon cancer screening - ordering today 7. Skin cancer screening- advised regular sunscreen use. Denies worrisome, changing, or new skin lesions.  8. Birth control/STD check- tubal ligation 9. Osteoporosis screening- N/A 10. Alcohol screening: rare 11. Smoking associated screening (lung cancer screening, AAA screen 65-75, UA)- non- smoker  Problem List Items Addressed This Visit       Cardiovascular and Mediastinum   HTN (hypertension)    chronic - stable taking HCTZ 25mg  qd sending refill f/u 6 mos      Relevant Orders   Comp Met (CMET)     Other   GAD (generalized anxiety disorder) - Primary    chronic - unstable taking Prozac 40mg  qd, has been on for years, but reports having more anxiety lately discussed increasing Prozac vs adding prn med pt prefers to try prn, sending Hydroxyzine 10mg  tid prn advised to let me know if not helping sx f/u in 6 mos or prn      Relevant Medications   hydrOXYzine (ATARAX) 10 MG tablet   FLUoxetine (PROZAC) 40 MG capsule   Other Visit Diagnoses     Encounter for general adult medical examination with abnormal findings       Relevant Orders   CBC with Differential/Platelet   Comp Met (CMET)   Lipid panel   TSH   Colon cancer screening       Relevant Orders   Ambulatory referral to Gastroenterology       Recommended follow up:  Return in about 6 months (around 01/05/2023) for HTN,  med refills, anxiety. Future Appointments  Date Time Provider [place]  01/03/2023  8:00 AM Jeanie Sewer, NP [facility]    Lab/Order associations: fasting   Jeanie Sewer, NP

## 2022-07-08 ENCOUNTER — Encounter: Payer: BC Managed Care – PPO | Admitting: Family

## 2022-07-11 ENCOUNTER — Encounter: Payer: Self-pay | Admitting: Physician Assistant

## 2022-07-12 NOTE — Progress Notes (Signed)
Your glucose (blood sugar) is slightly high, electrolytes, blood count, thyroid, liver & kidney function are all normal.  Your total cholesterol number & LDL (bad #) are better than last time, good job reducing the saturated fat in your diet! But still have a little work to do... Keep reducing any fried foods, alcohol, nonnutritional snacks e.g. chips/cookies,pies, cakes and candies, fatty meat (red meat), high fat dairy foods:  including cheese, milk, ice cream.  Increase fruits/vegetables/fiber.    Continue or restart an exercise routine, shooting for 42min 5-7days per week.

## 2022-08-02 ENCOUNTER — Telehealth: Payer: Self-pay

## 2022-08-02 NOTE — Telephone Encounter (Signed)
Pt states there is not family hx of colon cancer, her parents did have polyps but not cancer. Explained we do screening colons starting at age 41. She is not having any GI issues and was happy to cancel the appt.

## 2022-08-02 NOTE — Progress Notes (Deleted)
08/02/2022 NARITA URBACH [id number] 10-Aug-1980  Referring provider: Jeanie Sewer, NP Primary GI doctor: {acdocs:27040}  ASSESSMENT AND PLAN:   There are no diagnoses linked to this encounter.   Patient Care Team: Jeanie Sewer, NP as PCP - General (Family Medicine) Gustavo Lah, NP as Nurse Practitioner (Obstetrics and Gynecology)  HISTORY OF PRESENT ILLNESS: 42 y.o. female with a past medical history of anxiety, hypertension and others listed below presents for evaluation of ***.    She  reports that she quit smoking about 12 years ago. Her smoking use included cigarettes. She has never used smokeless tobacco. She reports current alcohol use of about 2.0 standard drinks of alcohol per week. She reports that she does not use drugs.  RELEVANT LABS AND IMAGING: CBC    Component Value Date/Time   WBC 7.8 07/07/2022 0923   RBC 4.61 07/07/2022 0923   HGB 14.5 07/07/2022 0923   HCT 41.6 07/07/2022 0923   PLT 372.0 07/07/2022 0923   MCV 90.2 07/07/2022 0923   MCH 30.7 01/25/2021 0927   MCHC 34.8 07/07/2022 0923   RDW 12.6 07/07/2022 0923   LYMPHSABS 1.7 07/07/2022 0923   MONOABS 0.5 07/07/2022 0923   EOSABS 0.3 07/07/2022 0923   BASOSABS 0.1 07/07/2022 0923   Recent Labs    07/07/22 0923  HGB 14.5     CMP     Component Value Date/Time   NA 139 07/07/2022 0923   K 3.8 07/07/2022 0923   CL 99 07/07/2022 0923   CO2 28 07/07/2022 0923   GLUCOSE 110 (H) 07/07/2022 0923   BUN 19 07/07/2022 0923   CREATININE 0.73 07/07/2022 0923   CALCIUM 10.0 07/07/2022 0923   PROT 7.9 07/07/2022 0923   ALBUMIN 4.8 07/07/2022 0923   AST 15 07/07/2022 0923   ALT 17 07/07/2022 0923   ALKPHOS 86 07/07/2022 0923   BILITOT 1.1 07/07/2022 0923   GFRNONAA >60 01/25/2021 0927      Latest Ref Rng & Units 07/07/2022    9:23 AM 09/11/2019    1:59 PM  Hepatic Function  Total Protein 6.0 - 8.3 g/dL 7.9  8.3   Albumin 3.5 - 5.2 g/dL 4.8  5.1   AST 0 - 37 U/L 15  22    ALT 0 - 35 U/L 17  27   Alk Phosphatase 39 - 117 U/L 86  67   Total Bilirubin 0.2 - 1.2 mg/dL 1.1  1.3       Current Medications:    Current Outpatient Medications (Cardiovascular):    hydrochlorothiazide (HYDRODIURIL) 25 MG tablet, TAKE 1 TABLET (25 MG TOTAL) BY MOUTH DAILY.     Current Outpatient Medications (Other):    FLUoxetine (PROZAC) 40 MG capsule, Take 1 capsule (40 mg total) by mouth daily.   hydrOXYzine (ATARAX) 10 MG tablet, Take 1 tablet (10 mg total) by mouth 3 (three) times daily as needed.   Multiple Vitamins-Minerals (MULTIVITAMIN WITH MINERALS) tablet, Take 1 tablet by mouth daily.  Medical History:  Past Medical History:  Diagnosis Date   Anxiety    Hypertension    Allergies: No Known Allergies   Surgical History:  She  has a past surgical history that includes Breast biopsy (Left, 10/2017); Biopsy thyroid (10/11/2019); and Laparoscopic tubal ligation (Bilateral, 01/28/2021). Family History:  Her family history includes Breast cancer in her mother; Breast cancer (age of onset: 53) in her paternal grandmother.  REVIEW OF SYSTEMS  : All other systems reviewed and negative except where  noted in the History of Present Illness.  PHYSICAL EXAM: There were no vitals taken for this visit. General Appearance: Well nourished, in no apparent distress. Head:   Normocephalic and atraumatic. Eyes:  sclerae anicteric,conjunctive pink  Respiratory: Respiratory effort normal, BS equal bilaterally without rales, rhonchi, wheezing. Cardio: RRR with no MRGs. Peripheral pulses intact.  Abdomen: Soft,  {BlankSingle:19197::"Flat","Obese","Non-distended"} ,active bowel sounds. {actendernessAB:27319} tenderness {anatomy; site abdomen:5010}. {BlankMultiple:19196::"Without guarding","With guarding","Without rebound","With rebound"}. No masses. Rectal: {acrectalexam:27461} Musculoskeletal: Full ROM, {PSY - GAIT AND STATION:22860} gait. {With/Without:304960234} edema. Skin:  Dry  and intact without significant lesions or rashes Neuro: Alert and  oriented x4;  No focal deficits. Psych:  Cooperative. Normal mood and affect.    [name], PA-C 8:13 AM

## 2022-08-02 NOTE — Telephone Encounter (Signed)
-----   Message from [name], [place] sent at 08/02/2022 12:45 PM EST ----- Regarding: Patient for tomorrow Can we call this patient and see why they are coming in? Appears no family history of colon cancer, no GI symptoms, PCP sent them for screening colon?  Not due until 46 unless I'm missing something.  Thanks! Meghan Shaw

## 2022-08-03 ENCOUNTER — Ambulatory Visit: Payer: BC Managed Care – PPO | Admitting: Physician Assistant

## 2022-12-22 ENCOUNTER — Other Ambulatory Visit: Payer: Self-pay | Admitting: Family

## 2022-12-22 DIAGNOSIS — I10 Essential (primary) hypertension: Secondary | ICD-10-CM

## 2022-12-29 DIAGNOSIS — Z01419 Encounter for gynecological examination (general) (routine) without abnormal findings: Secondary | ICD-10-CM | POA: Diagnosis not present

## 2022-12-30 ENCOUNTER — Ambulatory Visit (INDEPENDENT_AMBULATORY_CARE_PROVIDER_SITE_OTHER): Payer: BC Managed Care – PPO | Admitting: Family

## 2022-12-30 VITALS — BP 112/79 | HR 91 | Temp 98.0°F | Ht 60.0 in | Wt 144.4 lb

## 2022-12-30 DIAGNOSIS — M25511 Pain in right shoulder: Secondary | ICD-10-CM | POA: Diagnosis not present

## 2022-12-30 DIAGNOSIS — I10 Essential (primary) hypertension: Secondary | ICD-10-CM

## 2022-12-30 DIAGNOSIS — E782 Mixed hyperlipidemia: Secondary | ICD-10-CM | POA: Insufficient documentation

## 2022-12-30 DIAGNOSIS — F411 Generalized anxiety disorder: Secondary | ICD-10-CM | POA: Diagnosis not present

## 2022-12-30 MED ORDER — HYDROXYZINE HCL 10 MG PO TABS
10.0000 mg | ORAL_TABLET | Freq: Three times a day (TID) | ORAL | 2 refills | Status: DC | PRN
Start: 2022-12-30 — End: 2023-07-05

## 2022-12-30 MED ORDER — PREDNISONE 10 MG PO TABS
ORAL_TABLET | ORAL | 0 refills | Status: DC
Start: 2022-12-30 — End: 2023-07-05

## 2022-12-30 MED ORDER — FLUOXETINE HCL 40 MG PO CAPS
40.0000 mg | ORAL_CAPSULE | Freq: Every day | ORAL | 1 refills | Status: DC
Start: 2022-12-30 — End: 2023-07-05

## 2022-12-30 MED ORDER — HYDROCHLOROTHIAZIDE 25 MG PO TABS
25.0000 mg | ORAL_TABLET | Freq: Every day | ORAL | Status: DC
Start: 2022-12-30 — End: 2023-07-05

## 2022-12-30 MED ORDER — CYCLOBENZAPRINE HCL 5 MG PO TABS
5.0000 mg | ORAL_TABLET | Freq: Three times a day (TID) | ORAL | 1 refills | Status: DC | PRN
Start: 2022-12-30 — End: 2024-04-22

## 2022-12-30 NOTE — Progress Notes (Signed)
Patient ID: Meghan Shaw, female    DOB: 20-Apr-1981, 42 y.o.   MRN: [id number]  Chief Complaint  Patient presents with   Medication Refill   Hypertension   Shoulder Pain    Rt shoulder  Possible pinch nerve Pain radiating to hand, tingling in fingers  No hx injury     HPI: Right shoulder:  pt reports pain, tingling from her shoulder down her arm to her fingers. Started Tuesday am when she woke up, unsure if she slept on it wrong, but pain has persisted and she can't lift her arm up, she has to keep it in a certain position to avoid pain. states she has not been doing any heavy lifting, exercise, weight lifting, no specific injury. She denies every having sx before, but thinks it is a pinched nerve. Anxiety/Depression: Patient complains of anxiety disorder.   She has the following symptoms: none. Onset of symptoms was approximately 2 years ago, She denies current suicidal and homicidal ideation. Possible organic causes contributing are: none. Risk factors: none  Previous treatment includes Prozac.  She complains of the following side effects from the treatment: none. Hypertension: Patient is currently maintained on the following medications for blood pressure: HCTZ Patient reports good compliance with blood pressure medications. Patient denies chest pain, headaches, shortness of breath or swelling.  Assessment & Plan:  Primary hypertension Assessment & Plan: chronic - stable taking HCTZ 25mg  qd sending refill f/u 6 mos  Orders: -     hydroCHLOROthiazide; Take 1 tablet (25 mg total) by mouth daily.  GAD (generalized anxiety disorder) Assessment & Plan: chronic  taking Prozac 40mg  every day & Hydroxyzine 10mg  tid prn doing well, no SE sending refills f/u in 6 mos or prn  Orders: -     FLUoxetine HCl; Take 1 capsule (40 mg total) by mouth daily.  Dispense: 90 capsule; Refill: 1 -     hydrOXYzine HCl; Take 1 tablet (10 mg total) by mouth 3 (three) times daily as needed for  anxiety.  Dispense: 30 tablet; Refill: 2  Acute pain of right shoulder- sending pred dose pack, generic Flexeril, advised on use & SE, can apply heat or ice up to tid prn. Call back if not improving by next week.  -     Cyclobenzaprine HCl; Take 1-2 tablets (5-10 mg total) by mouth 3 (three) times daily as needed.  Dispense: 30 tablet; Refill: 1 -     predniSONE; 6 tab day 1, 5 tab day 2-3, 4 tab day 4, 3 tab day 5  Dispense: 23 tablet; Refill: 0  Subjective:    Outpatient Medications Prior to Visit  Medication Sig Dispense Refill   Multiple Vitamins-Minerals (MULTIVITAMIN WITH MINERALS) tablet Take 1 tablet by mouth daily.     FLUoxetine (PROZAC) 40 MG capsule Take 1 capsule (40 mg total) by mouth daily. 90 capsule 1   hydrochlorothiazide (HYDRODIURIL) 25 MG tablet TAKE 1 TABLET (25 MG TOTAL) BY MOUTH DAILY. 90 tablet 1   hydrOXYzine (ATARAX) 10 MG tablet Take 1 tablet (10 mg total) by mouth 3 (three) times daily as needed. 30 tablet 0   No facility-administered medications prior to visit.   Past Medical History:  Diagnosis Date   Anxiety    Hypertension    Past Surgical History:  Procedure Laterality Date   BIOPSY THYROID  10/11/2019   benign   BREAST BIOPSY Left 10/2017   x2   benign   LAPAROSCOPIC TUBAL LIGATION Bilateral 01/28/2021  Procedure: LAPAROSCOPIC TUBAL LIGATION By Salpingectomy;  Surgeon: Toy Baker, DO;  Location: [facility];  Service: Gynecology;  Laterality: Bilateral;   No Known Allergies    Objective:    Physical Exam Vitals and nursing note reviewed.  Constitutional:      Appearance: Normal appearance.  Cardiovascular:     Rate and Rhythm: Normal rate and regular rhythm.  Pulmonary:     Effort: Pulmonary effort is normal.     Breath sounds: Normal breath sounds.  Musculoskeletal:     Right shoulder: Tenderness present. No swelling. Decreased range of motion (can only hold in certain position to avoid pain). Decreased  strength.  Skin:    General: Skin is warm and dry.  Neurological:     Mental Status: She is alert.  Psychiatric:        Mood and Affect: Mood normal.        Behavior: Behavior normal.    BP 112/79   Pulse 91   Temp 98 F (36.7 C) (Temporal)   Ht 5' (1.524 m)   Wt 144 lb 6.4 oz (65.5 kg)   LMP 12/13/2022   SpO2 96%   BMI 28.20 kg/m  Wt Readings from Last 3 Encounters:  12/30/22 144 lb 6.4 oz (65.5 kg)  07/07/22 140 lb 4 oz (63.6 kg)  11/16/21 138 lb 8 oz (62.8 kg)       Dulce Sellar, NP

## 2022-12-30 NOTE — Assessment & Plan Note (Signed)
chronic  taking Prozac 40mg  every day & Hydroxyzine 10mg  tid prn doing well, no SE sending refills f/u in 6 mos or prn

## 2022-12-30 NOTE — Assessment & Plan Note (Signed)
chronic - stable taking HCTZ 25mg  qd sending refill f/u 6 mos

## 2023-01-03 ENCOUNTER — Ambulatory Visit: Payer: BC Managed Care – PPO | Admitting: Family

## 2023-01-24 ENCOUNTER — Other Ambulatory Visit: Payer: Self-pay | Admitting: Family

## 2023-01-24 DIAGNOSIS — Z1231 Encounter for screening mammogram for malignant neoplasm of breast: Secondary | ICD-10-CM

## 2023-02-20 ENCOUNTER — Ambulatory Visit
Admission: RE | Admit: 2023-02-20 | Discharge: 2023-02-20 | Disposition: A | Discharge status: Home / Self Care | Payer: BC Managed Care – PPO | Source: Ambulatory Visit | Attending: Family | Admitting: Family

## 2023-02-20 DIAGNOSIS — Z1231 Encounter for screening mammogram for malignant neoplasm of breast: Secondary | ICD-10-CM | POA: Diagnosis not present

## 2023-02-21 DIAGNOSIS — Z1231 Encounter for screening mammogram for malignant neoplasm of breast: Secondary | ICD-10-CM

## 2023-06-22 ENCOUNTER — Other Ambulatory Visit: Payer: Self-pay | Admitting: Family

## 2023-06-22 DIAGNOSIS — I10 Essential (primary) hypertension: Secondary | ICD-10-CM

## 2023-07-05 ENCOUNTER — Encounter: Payer: Self-pay | Admitting: Family

## 2023-07-05 ENCOUNTER — Ambulatory Visit: Payer: BC Managed Care – PPO | Admitting: Family

## 2023-07-05 VITALS — BP 120/82 | HR 120 | Temp 98.0°F | Ht 60.0 in | Wt 147.5 lb

## 2023-07-05 DIAGNOSIS — F411 Generalized anxiety disorder: Secondary | ICD-10-CM | POA: Diagnosis not present

## 2023-07-05 DIAGNOSIS — E782 Mixed hyperlipidemia: Secondary | ICD-10-CM

## 2023-07-05 DIAGNOSIS — I10 Essential (primary) hypertension: Secondary | ICD-10-CM

## 2023-07-05 DIAGNOSIS — E876 Hypokalemia: Secondary | ICD-10-CM

## 2023-07-05 LAB — LIPID PANEL
Cholesterol: 208 mg/dL — ABNORMAL HIGH (ref 0–200)
HDL: 46.7 mg/dL (ref >39.00)
LDL Cholesterol: 121 mg/dL — ABNORMAL HIGH (ref 0–99)
NonHDL: 161.04
Total CHOL/HDL Ratio: 4
Triglycerides: 200 mg/dL — ABNORMAL HIGH (ref 0.0–149.0)
VLDL: 40 mg/dL (ref 0.0–40.0)

## 2023-07-05 LAB — BASIC METABOLIC PANEL
BUN: 18 mg/dL (ref 6–23)
CO2: 25 meq/L (ref 19–32)
Calcium: 9.2 mg/dL (ref 8.4–10.5)
Chloride: 102 meq/L (ref 96–112)
Creatinine, Ser: 0.75 mg/dL (ref 0.40–1.20)
GFR: 98.82 mL/min (ref >60.00)
Glucose, Bld: 97 mg/dL (ref 70–99)
Potassium: 3.1 meq/L — ABNORMAL LOW (ref 3.5–5.1)
Sodium: 137 meq/L (ref 135–145)

## 2023-07-05 MED ORDER — HYDROCHLOROTHIAZIDE 25 MG PO TABS: 25.0000 mg | ORAL_TABLET | Freq: Every day | ORAL | 1 refills | Status: DC

## 2023-07-05 MED ORDER — HYDROXYZINE HCL 10 MG PO TABS: 10.0000 mg | ORAL_TABLET | Freq: Three times a day (TID) | ORAL | Status: DC | PRN

## 2023-07-05 MED ORDER — FLUOXETINE HCL 40 MG PO CAPS: 40.0000 mg | ORAL_CAPSULE | Freq: Every day | ORAL | 1 refills | Status: DC

## 2023-07-05 NOTE — Assessment & Plan Note (Signed)
Chronic, stable; Patient reports increased heart rate during medical visits. Currently on Fluoxetine 40mg  qd and Hydroxyzine as needed. -Continue Fluoxetine daily and Hydroxyzine as needed, refills sent. -F/U in 6mos

## 2023-07-05 NOTE — Progress Notes (Signed)
Patient ID: Meghan Shaw, female    DOB: 1981/02/01, 43 y.o.   MRN: [id number]  Chief Complaint  Patient presents with   Hypertension       Discussed the use of AI scribe software for clinical note transcription with the patient, who gave verbal consent to proceed.  History of Present Illness   The patient, on hydrochlorothiazide, fluoxetine, and hydroxyzine, presents for a routine check-up. She reports that her heart rate is usually high during medical visits, but it is around 80 at home. She has a family history of high cholesterol, with her mother currently on Lipitor for the same. She expresses interest in getting her cholesterol levels checked.     Assessment & Plan:     Hypertension - Chronic, Well controlled on Hydrochlorothiazide 25mg  qd -Continue Hydrochlorothiazide, sending refill. -Check electrolytes and kidney function due to Hydrochlorothiazide use. -F/U in 6 mos  Hyperlipidemia - Chronic, Family history of hyperlipidemia. Patient is concerned about potential genetic predisposition. Last LDL & TC high one year ago. -Recheck cholesterol panel today. -Advised on diet modification including reducing saturated fats and increasing exercise. -F/U in 1 year  Anxiety - Chronic, stable; Patient reports increased heart rate during medical visits. Currently on Fluoxetine 40mg  qd and Hydroxyzine as needed. -Continue Fluoxetine daily and Hydroxyzine as needed, refills sent. -F/U in 6mos     Subjective:    Outpatient Medications Prior to Visit  Medication Sig Dispense Refill   cyclobenzaprine (FLEXERIL) 5 MG tablet Take 1-2 tablets (5-10 mg total) by mouth 3 (three) times daily as needed. 30 tablet 1   FLUoxetine (PROZAC) 40 MG capsule Take 1 capsule (40 mg total) by mouth daily. 90 capsule 1   hydrochlorothiazide (HYDRODIURIL) 25 MG tablet Take 1 tablet (25 mg total) by mouth daily.     hydrOXYzine (ATARAX) 10 MG tablet Take 1 tablet (10 mg total) by mouth 3 (three) times  daily as needed for anxiety. 30 tablet 2   Multiple Vitamins-Minerals (MULTIVITAMIN WITH MINERALS) tablet Take 1 tablet by mouth daily.     predniSONE (DELTASONE) 10 MG tablet 6 tab day 1, 5 tab day 2-3, 4 tab day 4, 3 tab day 5 23 tablet 0   No facility-administered medications prior to visit.   Past Medical History:  Diagnosis Date   Anxiety    Hypertension    Past Surgical History:  Procedure Laterality Date   BIOPSY THYROID  10/11/2019   benign   BREAST BIOPSY Left 10/2017   x2   benign   LAPAROSCOPIC TUBAL LIGATION Bilateral 01/28/2021   Procedure: LAPAROSCOPIC TUBAL LIGATION By Salpingectomy;  Surgeon: Toy Baker, DO;  Location: [facility];  Service: Gynecology;  Laterality: Bilateral;   No Known Allergies    Objective:    Physical Exam Vitals and nursing note reviewed.  Constitutional:      Appearance: Normal appearance.  Cardiovascular:     Rate and Rhythm: Normal rate and regular rhythm.  Pulmonary:     Effort: Pulmonary effort is normal.     Breath sounds: Normal breath sounds.  Musculoskeletal:        General: Normal range of motion.  Skin:    General: Skin is warm and dry.  Neurological:     Mental Status: She is alert.  Psychiatric:        Mood and Affect: Mood normal.        Behavior: Behavior normal.    BP 120/82 (BP Location: Left Arm, Patient Position:  Sitting, Cuff Size: Normal)   Pulse (!) 120   Temp 98 F (36.7 C) (Temporal)   Ht 5' (1.524 m)   Wt 147 lb 8 oz (66.9 kg)   LMP 06/21/2023 (Approximate)   SpO2 98%   BMI 28.81 kg/m  Wt Readings from Last 3 Encounters:  07/05/23 147 lb 8 oz (66.9 kg)  12/30/22 144 lb 6.4 oz (65.5 kg)  07/07/22 140 lb 4 oz (63.6 kg)       Dulce Sellar, NP

## 2023-07-05 NOTE — Assessment & Plan Note (Signed)
Chronic, Family history of hyperlipidemia. Patient is concerned about potential genetic predisposition. Last LDL & TC high one year ago. -Recheck cholesterol panel today. -Advised on diet modification including reducing saturated fats and increasing exercise. -F/U in 1 year

## 2023-07-05 NOTE — Assessment & Plan Note (Signed)
Well controlled on Hydrochlorothiazide 25mg  qd -Continue Hydrochlorothiazide, sending refill. -Check electrolytes and kidney function due to Hydrochlorothiazide use. -F/U in 6 mos

## 2023-07-06 ENCOUNTER — Encounter: Payer: Self-pay | Admitting: Family

## 2023-07-06 NOTE — Addendum Note (Signed)
Addended byDulce Sellar on: 07/06/2023 05:45 PM   Modules accepted: Orders

## 2023-12-28 ENCOUNTER — Other Ambulatory Visit: Payer: Self-pay | Admitting: Family

## 2023-12-28 DIAGNOSIS — I10 Essential (primary) hypertension: Secondary | ICD-10-CM

## 2024-01-08 ENCOUNTER — Other Ambulatory Visit: Payer: Self-pay | Admitting: Family

## 2024-01-08 DIAGNOSIS — Z1231 Encounter for screening mammogram for malignant neoplasm of breast: Secondary | ICD-10-CM

## 2024-02-21 ENCOUNTER — Ambulatory Visit
Admission: RE | Admit: 2024-02-21 | Discharge: 2024-02-21 | Disposition: A | Discharge status: Home / Self Care | Source: Ambulatory Visit | Attending: Family | Admitting: Family

## 2024-02-21 DIAGNOSIS — Z1231 Encounter for screening mammogram for malignant neoplasm of breast: Secondary | ICD-10-CM | POA: Diagnosis not present

## 2024-03-25 ENCOUNTER — Other Ambulatory Visit: Payer: Self-pay | Admitting: Family

## 2024-03-25 DIAGNOSIS — I10 Essential (primary) hypertension: Secondary | ICD-10-CM

## 2024-03-29 ENCOUNTER — Other Ambulatory Visit: Payer: Self-pay | Admitting: Family

## 2024-03-29 DIAGNOSIS — F411 Generalized anxiety disorder: Secondary | ICD-10-CM

## 2024-04-22 ENCOUNTER — Ambulatory Visit: Admitting: Family

## 2024-04-22 ENCOUNTER — Encounter: Payer: Self-pay | Admitting: Family

## 2024-04-22 VITALS — BP 110/70 | HR 97 | Temp 97.3°F | Ht 60.0 in | Wt 147.4 lb

## 2024-04-22 DIAGNOSIS — E876 Hypokalemia: Secondary | ICD-10-CM

## 2024-04-22 DIAGNOSIS — I10 Essential (primary) hypertension: Secondary | ICD-10-CM

## 2024-04-22 DIAGNOSIS — F411 Generalized anxiety disorder: Secondary | ICD-10-CM

## 2024-04-22 DIAGNOSIS — G4725 Circadian rhythm sleep disorder, jet lag type: Secondary | ICD-10-CM | POA: Diagnosis not present

## 2024-04-22 LAB — BASIC METABOLIC PANEL WITH GFR
BUN: 16 mg/dL (ref 6–23)
CO2: 25 meq/L (ref 19–32)
Calcium: 9.1 mg/dL (ref 8.4–10.5)
Chloride: 101 meq/L (ref 96–112)
Creatinine, Ser: 0.75 mg/dL (ref 0.40–1.20)
GFR: 98.26 mL/min (ref >60.00)
Glucose, Bld: 97 mg/dL (ref 70–99)
Potassium: 4.4 meq/L (ref 3.5–5.1)
Sodium: 136 meq/L (ref 135–145)

## 2024-04-22 MED ORDER — ZOLPIDEM TARTRATE 5 MG PO TABS: 5.0000 mg | ORAL_TABLET | Freq: Every evening | ORAL | 0 refills | Status: AC | PRN

## 2024-04-22 MED ORDER — FLUOXETINE HCL 40 MG PO CAPS
40.0000 mg | ORAL_CAPSULE | Freq: Every day | ORAL | 0 refills | Status: AC
Start: 2024-04-22 — End: ?

## 2024-04-22 MED ORDER — HYDROXYZINE HCL 10 MG PO TABS: 10.0000 mg | ORAL_TABLET | Freq: Three times a day (TID) | ORAL | 2 refills | Status: AC | PRN

## 2024-04-22 MED ORDER — HYDROCHLOROTHIAZIDE 25 MG PO TABS: 25.0000 mg | ORAL_TABLET | Freq: Every day | ORAL | 0 refills | Status: DC

## 2024-04-22 NOTE — Progress Notes (Signed)
 Patient ID: Meghan Shaw, female    DOB: 1980-09-02, 43 y.o.   MRN: [id number]  Chief Complaint  Patient presents with   Travel Consult    Traveling to [place] and has trouble sleeping during travel. Has used Ambien in the past.   Discussed the use of AI scribe software for clinical note transcription with the patient, who gave verbal consent to proceed.  History of Present Illness Meghan Shaw is a 43 year old female who presents for f/u for HTN, anxiety and would like a prescription for zolpidem for sleep management during travel.  She is planning a 16-day trip to [place], [place], and [place], including a cruise, and is concerned about managing sleep and jet lag. She has previously used Ambien, taking the lowest dose of 5 mg, and found it effective. She is also seeking refills for fluoxetine , which is effective for her anxiety without issues. Her potassium levels were low in January while taking hydrochlorothiazide  for her HTN. She is open to a follow-up blood draw to reassess potassium levels.  Assessment & Plan Hypokalemia secondary to hydrochlorothiazide  therapy Hypokalemia likely due to HCTZ use. Potassium is crucial for cardiac function. - Ordered blood draw to assess current potassium levels. - Will initiate potassium supplementation if levels are low. - Will consider over-the-counter potassium if levels are borderline.  Essential hypertension Blood pressure is well-controlled on current regimen of HCTZ 25mg  qam, last time low potassium, rechecking today. BP doing well. - Sending refill of HCTZ 25mg  qam - F/U in 6 mos  Generalized anxiety disorder Currently managed with fluoxetine  40mg  qd, which is effective with no reported issues. - Sending refill of Fluoxetine  40mg  qd - F/U in 6 mos  General Health Maintenance Discussion about travel plans and the need for zolpidem for sleep during the trip. Zolpidem is a controlled substance. - Prescribed zolpidem 5mg  qhs for sleep during  travel. - Advised to keep zolpidem secure to prevent loss or theft.  Subjective:    Outpatient Medications Prior to Visit  Medication Sig Dispense Refill   hydrOXYzine  (ATARAX ) 10 MG tablet Take 1 tablet (10 mg total) by mouth 3 (three) times daily as needed for anxiety.     Multiple Vitamins-Minerals (MULTIVITAMIN WITH MINERALS) tablet Take 1 tablet by mouth daily.     FLUoxetine  (PROZAC ) 40 MG capsule Take 1 capsule (40 mg total) by mouth daily. Please schedule a follow up appointment. 30 capsule 0   hydrochlorothiazide  (HYDRODIURIL ) 25 MG tablet Take 1 tablet (25 mg total) by mouth daily. Please schedule f/u appointment for further refills. 90 tablet 0   cyclobenzaprine  (FLEXERIL ) 5 MG tablet Take 1-2 tablets (5-10 mg total) by mouth 3 (three) times daily as needed. (Patient not taking: Reported on 04/22/2024) 30 tablet 1   No facility-administered medications prior to visit.   Past Medical History:  Diagnosis Date   Anxiety    Hypertension    Past Surgical History:  Procedure Laterality Date   BIOPSY THYROID   10/11/2019   benign   BREAST BIOPSY Left 10/2017   x2   benign   LAPAROSCOPIC TUBAL LIGATION Bilateral 01/28/2021   Procedure: LAPAROSCOPIC TUBAL LIGATION By Salpingectomy;  Surgeon: Rendell Calton LABOR, DO;  Location: [facility];  Service: Gynecology;  Laterality: Bilateral;   No Known Allergies    Objective:    Physical Exam Vitals and nursing note reviewed.  Constitutional:      Appearance: Normal appearance.  Cardiovascular:     Rate and Rhythm:  Normal rate and regular rhythm.  Pulmonary:     Effort: Pulmonary effort is normal.     Breath sounds: Normal breath sounds.  Musculoskeletal:        General: Normal range of motion.  Skin:    General: Skin is warm and dry.  Neurological:     Mental Status: She is alert.  Psychiatric:        Mood and Affect: Mood normal.        Behavior: Behavior normal.    BP 110/70   Pulse 97   Temp (!) 97.3 F  (36.3 C) (Temporal)   Ht 5' (1.524 m)   Wt 147 lb 6.4 oz (66.9 kg)   LMP 04/18/2024   SpO2 99%   BMI 28.79 kg/m  Wt Readings from Last 3 Encounters:  04/22/24 147 lb 6.4 oz (66.9 kg)  07/05/23 147 lb 8 oz (66.9 kg)  12/30/22 144 lb 6.4 oz (65.5 kg)      Lucius Krabbe, NP

## 2024-04-23 ENCOUNTER — Ambulatory Visit: Payer: Self-pay | Admitting: Family

## 2024-04-27 ENCOUNTER — Other Ambulatory Visit: Payer: Self-pay | Admitting: Family

## 2024-04-27 DIAGNOSIS — F411 Generalized anxiety disorder: Secondary | ICD-10-CM

## 2024-07-17 ENCOUNTER — Other Ambulatory Visit: Payer: Self-pay | Admitting: Family

## 2024-07-17 DIAGNOSIS — I10 Essential (primary) hypertension: Secondary | ICD-10-CM
